# Patient Record
Sex: Female | Born: 1970 | Race: White | Hispanic: Yes | Marital: Single | Smoking: Never smoker
Health system: Southern US, Community
[De-identification: ages and names within clinical notes are randomized; demographics above are authoritative.]

## PROBLEM LIST (undated history)

## (undated) DIAGNOSIS — J302 Other seasonal allergic rhinitis: Secondary | ICD-10-CM

## (undated) DIAGNOSIS — F418 Other specified anxiety disorders: Secondary | ICD-10-CM

## (undated) DIAGNOSIS — L819 Disorder of pigmentation, unspecified: Secondary | ICD-10-CM

## (undated) HISTORY — DX: Other specified anxiety disorders: F41.8

## (undated) HISTORY — DX: Disorder of pigmentation, unspecified: L81.9

## (undated) HISTORY — DX: Other seasonal allergic rhinitis: J30.2

---

## 1997-12-15 ENCOUNTER — Encounter (INDEPENDENT_AMBULATORY_CARE_PROVIDER_SITE_OTHER): Payer: Self-pay | Admitting: *Deleted

## 1997-12-15 LAB — CONVERTED CEMR LAB

## 1998-05-23 ENCOUNTER — Emergency Department (HOSPITAL_COMMUNITY): Admission: EM | Admit: 1998-05-23 | Discharge: 1998-05-23 | Payer: Self-pay | Admitting: Emergency Medicine

## 1998-05-24 ENCOUNTER — Encounter: Payer: Self-pay | Admitting: Emergency Medicine

## 1998-10-02 ENCOUNTER — Inpatient Hospital Stay (HOSPITAL_COMMUNITY): Admission: AD | Admit: 1998-10-02 | Discharge: 1998-10-08 | Payer: Self-pay | Admitting: *Deleted

## 1998-10-11 ENCOUNTER — Encounter: Admission: RE | Admit: 1998-10-11 | Discharge: 1998-10-11 | Payer: Self-pay | Admitting: Obstetrics

## 1998-10-18 ENCOUNTER — Encounter: Admission: RE | Admit: 1998-10-18 | Discharge: 1998-10-18 | Payer: Self-pay | Admitting: *Deleted

## 1998-11-02 ENCOUNTER — Inpatient Hospital Stay (HOSPITAL_COMMUNITY): Admission: AD | Admit: 1998-11-02 | Discharge: 1998-11-02 | Payer: Self-pay | Admitting: *Deleted

## 1998-11-08 ENCOUNTER — Inpatient Hospital Stay (HOSPITAL_COMMUNITY): Admission: RE | Admit: 1998-11-08 | Discharge: 1998-11-08 | Payer: Self-pay | Admitting: Obstetrics

## 1998-11-15 ENCOUNTER — Inpatient Hospital Stay (HOSPITAL_COMMUNITY): Admission: AD | Admit: 1998-11-15 | Discharge: 1998-11-18 | Payer: Self-pay | Admitting: Obstetrics

## 1998-11-15 ENCOUNTER — Encounter (INDEPENDENT_AMBULATORY_CARE_PROVIDER_SITE_OTHER): Payer: Self-pay | Admitting: Specialist

## 1998-11-18 ENCOUNTER — Encounter (HOSPITAL_COMMUNITY): Admission: RE | Admit: 1998-11-18 | Discharge: 1999-02-16 | Payer: Self-pay | Admitting: *Deleted

## 1998-11-29 ENCOUNTER — Encounter: Admission: RE | Admit: 1998-11-29 | Discharge: 1998-11-29 | Payer: Self-pay | Admitting: Obstetrics

## 1999-01-10 ENCOUNTER — Inpatient Hospital Stay (HOSPITAL_COMMUNITY): Admission: AD | Admit: 1999-01-10 | Discharge: 1999-01-10 | Payer: Self-pay | Admitting: Obstetrics & Gynecology

## 1999-07-18 ENCOUNTER — Encounter: Payer: Self-pay | Admitting: Internal Medicine

## 1999-07-18 ENCOUNTER — Emergency Department (HOSPITAL_COMMUNITY): Admission: EM | Admit: 1999-07-18 | Discharge: 1999-07-18 | Payer: Self-pay | Admitting: Internal Medicine

## 1999-07-18 ENCOUNTER — Encounter: Payer: Self-pay | Admitting: Emergency Medicine

## 1999-08-09 ENCOUNTER — Emergency Department (HOSPITAL_COMMUNITY): Admission: EM | Admit: 1999-08-09 | Discharge: 1999-08-09 | Payer: Self-pay

## 1999-12-02 ENCOUNTER — Encounter: Admission: RE | Admit: 1999-12-02 | Discharge: 1999-12-02 | Payer: Self-pay | Admitting: Family Medicine

## 2000-02-26 ENCOUNTER — Encounter: Payer: Self-pay | Admitting: Family Medicine

## 2000-02-26 ENCOUNTER — Ambulatory Visit (HOSPITAL_COMMUNITY): Admission: RE | Admit: 2000-02-26 | Discharge: 2000-02-26 | Payer: Self-pay | Admitting: Family Medicine

## 2000-10-19 ENCOUNTER — Encounter: Admission: RE | Admit: 2000-10-19 | Discharge: 2000-10-19 | Payer: Self-pay | Admitting: Internal Medicine

## 2000-11-02 ENCOUNTER — Ambulatory Visit (HOSPITAL_COMMUNITY): Admission: RE | Admit: 2000-11-02 | Discharge: 2000-11-02 | Payer: Self-pay | Admitting: Internal Medicine

## 2000-11-02 ENCOUNTER — Encounter: Admission: RE | Admit: 2000-11-02 | Discharge: 2000-11-02 | Payer: Self-pay | Admitting: Internal Medicine

## 2000-11-02 ENCOUNTER — Encounter: Payer: Self-pay | Admitting: Internal Medicine

## 2000-12-21 ENCOUNTER — Encounter: Admission: RE | Admit: 2000-12-21 | Discharge: 2000-12-21 | Payer: Self-pay | Admitting: Family Medicine

## 2001-08-02 ENCOUNTER — Encounter: Admission: RE | Admit: 2001-08-02 | Discharge: 2001-08-02 | Payer: Self-pay | Admitting: Podiatry

## 2001-09-06 ENCOUNTER — Encounter: Admission: RE | Admit: 2001-09-06 | Discharge: 2001-09-06 | Payer: Self-pay | Admitting: Family Medicine

## 2001-09-22 ENCOUNTER — Encounter: Admission: RE | Admit: 2001-09-22 | Discharge: 2001-09-22 | Payer: Self-pay | Admitting: Family Medicine

## 2001-10-11 ENCOUNTER — Encounter: Admission: RE | Admit: 2001-10-11 | Discharge: 2001-10-11 | Payer: Self-pay | Admitting: Family Medicine

## 2001-10-14 ENCOUNTER — Encounter: Admission: RE | Admit: 2001-10-14 | Discharge: 2001-10-14 | Payer: Self-pay | Admitting: Family Medicine

## 2001-10-21 ENCOUNTER — Ambulatory Visit (HOSPITAL_COMMUNITY): Admission: RE | Admit: 2001-10-21 | Discharge: 2001-10-21 | Payer: Self-pay | Admitting: *Deleted

## 2001-11-11 ENCOUNTER — Encounter: Admission: RE | Admit: 2001-11-11 | Discharge: 2001-11-11 | Payer: Self-pay | Admitting: Sports Medicine

## 2001-12-10 ENCOUNTER — Encounter: Admission: RE | Admit: 2001-12-10 | Discharge: 2001-12-10 | Payer: Self-pay | Admitting: Family Medicine

## 2001-12-14 ENCOUNTER — Ambulatory Visit (HOSPITAL_COMMUNITY): Admission: RE | Admit: 2001-12-14 | Discharge: 2001-12-14 | Payer: Self-pay | Admitting: Family Medicine

## 2002-01-18 ENCOUNTER — Encounter: Admission: RE | Admit: 2002-01-18 | Discharge: 2002-01-18 | Payer: Self-pay | Admitting: Family Medicine

## 2002-01-25 ENCOUNTER — Encounter: Admission: RE | Admit: 2002-01-25 | Discharge: 2002-01-25 | Payer: Self-pay | Admitting: Family Medicine

## 2002-02-21 ENCOUNTER — Encounter: Admission: RE | Admit: 2002-02-21 | Discharge: 2002-02-21 | Payer: Self-pay | Admitting: Family Medicine

## 2002-03-11 ENCOUNTER — Encounter: Admission: RE | Admit: 2002-03-11 | Discharge: 2002-03-11 | Payer: Self-pay | Admitting: Family Medicine

## 2002-03-31 ENCOUNTER — Encounter: Admission: RE | Admit: 2002-03-31 | Discharge: 2002-03-31 | Payer: Self-pay | Admitting: Family Medicine

## 2002-04-18 ENCOUNTER — Encounter: Admission: RE | Admit: 2002-04-18 | Discharge: 2002-04-18 | Payer: Self-pay | Admitting: Family Medicine

## 2002-04-25 ENCOUNTER — Encounter: Admission: RE | Admit: 2002-04-25 | Discharge: 2002-04-25 | Payer: Self-pay | Admitting: Family Medicine

## 2002-04-26 ENCOUNTER — Encounter: Payer: Self-pay | Admitting: *Deleted

## 2002-04-26 ENCOUNTER — Encounter (HOSPITAL_COMMUNITY): Admission: RE | Admit: 2002-04-26 | Discharge: 2002-04-27 | Payer: Self-pay | Admitting: *Deleted

## 2002-04-26 ENCOUNTER — Inpatient Hospital Stay (HOSPITAL_COMMUNITY): Admission: AD | Admit: 2002-04-26 | Discharge: 2002-04-29 | Payer: Self-pay | Admitting: *Deleted

## 2002-05-06 ENCOUNTER — Encounter: Admission: RE | Admit: 2002-05-06 | Discharge: 2002-05-06 | Payer: Self-pay | Admitting: Family Medicine

## 2002-07-25 ENCOUNTER — Encounter: Admission: RE | Admit: 2002-07-25 | Discharge: 2002-07-25 | Payer: Self-pay | Admitting: Family Medicine

## 2002-12-14 ENCOUNTER — Ambulatory Visit (HOSPITAL_BASED_OUTPATIENT_CLINIC_OR_DEPARTMENT_OTHER): Admission: RE | Admit: 2002-12-14 | Discharge: 2002-12-14 | Payer: Self-pay | Admitting: Otolaryngology

## 2002-12-14 ENCOUNTER — Encounter (INDEPENDENT_AMBULATORY_CARE_PROVIDER_SITE_OTHER): Payer: Self-pay | Admitting: Specialist

## 2002-12-14 ENCOUNTER — Ambulatory Visit (HOSPITAL_COMMUNITY): Admission: RE | Admit: 2002-12-14 | Discharge: 2002-12-14 | Payer: Self-pay | Admitting: Otolaryngology

## 2003-02-15 ENCOUNTER — Encounter: Admission: RE | Admit: 2003-02-15 | Discharge: 2003-02-15 | Payer: Self-pay | Admitting: Family Medicine

## 2003-05-22 ENCOUNTER — Encounter: Admission: RE | Admit: 2003-05-22 | Discharge: 2003-05-22 | Payer: Self-pay | Admitting: Family Medicine

## 2003-07-04 ENCOUNTER — Encounter: Admission: RE | Admit: 2003-07-04 | Discharge: 2003-07-04 | Payer: Self-pay | Admitting: Family Medicine

## 2003-07-11 ENCOUNTER — Encounter: Admission: RE | Admit: 2003-07-11 | Discharge: 2003-07-11 | Payer: Self-pay | Admitting: Sports Medicine

## 2003-08-08 ENCOUNTER — Encounter: Admission: RE | Admit: 2003-08-08 | Discharge: 2003-08-08 | Payer: Self-pay | Admitting: Family Medicine

## 2003-08-15 ENCOUNTER — Ambulatory Visit (HOSPITAL_COMMUNITY): Admission: RE | Admit: 2003-08-15 | Discharge: 2003-08-15 | Payer: Self-pay | Admitting: *Deleted

## 2003-09-08 ENCOUNTER — Encounter: Admission: RE | Admit: 2003-09-08 | Discharge: 2003-09-08 | Payer: Self-pay | Admitting: Sports Medicine

## 2003-10-04 ENCOUNTER — Encounter: Admission: RE | Admit: 2003-10-04 | Discharge: 2003-10-04 | Payer: Self-pay | Admitting: Family Medicine

## 2003-10-10 ENCOUNTER — Encounter: Admission: RE | Admit: 2003-10-10 | Discharge: 2003-10-10 | Payer: Self-pay | Admitting: Family Medicine

## 2003-10-31 ENCOUNTER — Encounter: Admission: RE | Admit: 2003-10-31 | Discharge: 2003-10-31 | Payer: Self-pay | Admitting: Family Medicine

## 2003-11-10 ENCOUNTER — Encounter: Admission: RE | Admit: 2003-11-10 | Discharge: 2003-11-10 | Payer: Self-pay | Admitting: Family Medicine

## 2003-12-18 ENCOUNTER — Ambulatory Visit: Payer: Self-pay | Admitting: Family Medicine

## 2003-12-26 ENCOUNTER — Ambulatory Visit: Payer: Self-pay | Admitting: Family Medicine

## 2004-01-05 ENCOUNTER — Ambulatory Visit: Payer: Self-pay

## 2004-01-08 ENCOUNTER — Ambulatory Visit: Payer: Self-pay | Admitting: Family Medicine

## 2004-01-11 ENCOUNTER — Ambulatory Visit: Payer: Self-pay | Admitting: Obstetrics & Gynecology

## 2004-01-15 ENCOUNTER — Ambulatory Visit: Payer: Self-pay | Admitting: Sports Medicine

## 2004-01-17 ENCOUNTER — Ambulatory Visit: Payer: Self-pay | Admitting: Obstetrics and Gynecology

## 2004-01-22 ENCOUNTER — Ambulatory Visit: Payer: Self-pay | Admitting: *Deleted

## 2004-01-22 ENCOUNTER — Ambulatory Visit: Payer: Self-pay | Admitting: Family Medicine

## 2004-01-22 ENCOUNTER — Inpatient Hospital Stay (HOSPITAL_COMMUNITY): Admission: AD | Admit: 2004-01-22 | Discharge: 2004-01-25 | Payer: Self-pay | Admitting: Obstetrics & Gynecology

## 2004-03-13 ENCOUNTER — Ambulatory Visit: Payer: Self-pay | Admitting: Sports Medicine

## 2004-06-24 ENCOUNTER — Ambulatory Visit: Payer: Self-pay | Admitting: Internal Medicine

## 2004-07-01 ENCOUNTER — Ambulatory Visit: Payer: Self-pay | Admitting: Internal Medicine

## 2004-07-15 ENCOUNTER — Ambulatory Visit: Payer: Self-pay | Admitting: Family Medicine

## 2004-09-24 ENCOUNTER — Ambulatory Visit: Payer: Self-pay | Admitting: Family Medicine

## 2004-09-25 ENCOUNTER — Ambulatory Visit: Payer: Self-pay | Admitting: *Deleted

## 2004-10-14 ENCOUNTER — Ambulatory Visit: Payer: Self-pay | Admitting: Family Medicine

## 2004-10-16 ENCOUNTER — Ambulatory Visit (HOSPITAL_COMMUNITY): Admission: RE | Admit: 2004-10-16 | Discharge: 2004-10-16 | Payer: Self-pay | Admitting: Family Medicine

## 2004-11-08 ENCOUNTER — Ambulatory Visit: Payer: Self-pay | Admitting: Family Medicine

## 2004-11-12 ENCOUNTER — Ambulatory Visit: Payer: Self-pay | Admitting: Family Medicine

## 2006-05-14 DIAGNOSIS — F411 Generalized anxiety disorder: Secondary | ICD-10-CM | POA: Insufficient documentation

## 2006-05-14 DIAGNOSIS — J3089 Other allergic rhinitis: Secondary | ICD-10-CM

## 2006-05-15 ENCOUNTER — Encounter (INDEPENDENT_AMBULATORY_CARE_PROVIDER_SITE_OTHER): Payer: Self-pay | Admitting: *Deleted

## 2007-01-04 ENCOUNTER — Encounter: Payer: Self-pay | Admitting: Family Medicine

## 2007-01-04 ENCOUNTER — Ambulatory Visit: Payer: Self-pay | Admitting: Family Medicine

## 2007-01-04 ENCOUNTER — Encounter (INDEPENDENT_AMBULATORY_CARE_PROVIDER_SITE_OTHER): Payer: Self-pay | Admitting: Family Medicine

## 2007-01-04 DIAGNOSIS — M545 Low back pain, unspecified: Secondary | ICD-10-CM | POA: Insufficient documentation

## 2007-01-04 DIAGNOSIS — N3946 Mixed incontinence: Secondary | ICD-10-CM | POA: Insufficient documentation

## 2007-01-04 LAB — CONVERTED CEMR LAB
Bilirubin Urine: NEGATIVE
Chlamydia, DNA Probe: NEGATIVE
GC Probe Amp, Genital: NEGATIVE
Ketones, urine, test strip: NEGATIVE
Urobilinogen, UA: 0.2
pH: 6.5

## 2007-01-05 ENCOUNTER — Encounter (INDEPENDENT_AMBULATORY_CARE_PROVIDER_SITE_OTHER): Payer: Self-pay | Admitting: Family Medicine

## 2007-01-05 ENCOUNTER — Encounter: Payer: Self-pay | Admitting: Family Medicine

## 2007-01-05 ENCOUNTER — Ambulatory Visit (HOSPITAL_COMMUNITY): Admission: RE | Admit: 2007-01-05 | Discharge: 2007-01-05 | Payer: Self-pay | Admitting: Family Medicine

## 2007-01-11 ENCOUNTER — Encounter: Payer: Self-pay | Admitting: Family Medicine

## 2007-01-22 ENCOUNTER — Encounter (INDEPENDENT_AMBULATORY_CARE_PROVIDER_SITE_OTHER): Payer: Self-pay | Admitting: Family Medicine

## 2007-09-28 ENCOUNTER — Encounter (INDEPENDENT_AMBULATORY_CARE_PROVIDER_SITE_OTHER): Payer: Self-pay | Admitting: Family Medicine

## 2007-09-28 ENCOUNTER — Ambulatory Visit: Payer: Self-pay | Admitting: Internal Medicine

## 2007-09-28 LAB — CONVERTED CEMR LAB
Albumin: 4.5 g/dL (ref 3.5–5.2)
Basophils Absolute: 0 10*3/uL (ref 0.0–0.1)
Basophils Relative: 1 % (ref 0–1)
CO2: 24 meq/L (ref 19–32)
Calcium: 9 mg/dL (ref 8.4–10.5)
Creatinine, Ser: 0.61 mg/dL (ref 0.40–1.20)
Eosinophils Relative: 4 % (ref 0–5)
Lymphs Abs: 2.3 10*3/uL (ref 0.7–4.0)
Monocytes Absolute: 0.4 10*3/uL (ref 0.1–1.0)
Neutrophils Relative %: 59 % (ref 43–77)
Potassium: 4.1 meq/L (ref 3.5–5.3)
Sed Rate: 8 mm/hr (ref 0–22)
Sodium: 139 meq/L (ref 135–145)
TSH: 1.519 microintl units/mL (ref 0.350–4.50)
Total Protein: 7.3 g/dL (ref 6.0–8.3)
Vit D, 1,25-Dihydroxy: 26 — ABNORMAL LOW (ref 30–89)
WBC: 7.2 10*3/uL (ref 4.0–10.5)

## 2007-11-15 ENCOUNTER — Ambulatory Visit: Payer: Self-pay | Admitting: Internal Medicine

## 2008-01-27 ENCOUNTER — Ambulatory Visit: Payer: Self-pay | Admitting: Family Medicine

## 2008-04-25 ENCOUNTER — Encounter (INDEPENDENT_AMBULATORY_CARE_PROVIDER_SITE_OTHER): Payer: Self-pay | Admitting: Family Medicine

## 2008-04-25 ENCOUNTER — Ambulatory Visit: Payer: Self-pay | Admitting: Internal Medicine

## 2008-04-25 LAB — CONVERTED CEMR LAB: Chlamydia, DNA Probe: NEGATIVE

## 2008-05-02 ENCOUNTER — Ambulatory Visit (HOSPITAL_COMMUNITY): Admission: RE | Admit: 2008-05-02 | Discharge: 2008-05-02 | Payer: Self-pay | Admitting: Internal Medicine

## 2008-05-15 ENCOUNTER — Encounter: Admission: RE | Admit: 2008-05-15 | Discharge: 2008-05-15 | Payer: Self-pay | Admitting: Internal Medicine

## 2008-08-02 ENCOUNTER — Ambulatory Visit: Payer: Self-pay | Admitting: Family Medicine

## 2008-10-23 ENCOUNTER — Ambulatory Visit: Payer: Self-pay | Admitting: Family Medicine

## 2008-10-23 DIAGNOSIS — R002 Palpitations: Secondary | ICD-10-CM

## 2008-11-28 ENCOUNTER — Encounter: Admission: RE | Admit: 2008-11-28 | Discharge: 2008-11-28 | Payer: Self-pay | Admitting: Internal Medicine

## 2008-11-28 LAB — HM MAMMOGRAPHY

## 2009-02-19 ENCOUNTER — Ambulatory Visit: Payer: Self-pay | Admitting: Family Medicine

## 2009-02-19 DIAGNOSIS — K146 Glossodynia: Secondary | ICD-10-CM

## 2009-02-19 DIAGNOSIS — R413 Other amnesia: Secondary | ICD-10-CM | POA: Insufficient documentation

## 2009-02-23 ENCOUNTER — Ambulatory Visit: Payer: Self-pay | Admitting: Family Medicine

## 2009-02-23 ENCOUNTER — Encounter: Payer: Self-pay | Admitting: Family Medicine

## 2009-02-23 LAB — CONVERTED CEMR LAB
ALT: 16 units/L (ref 0–35)
AST: 15 units/L (ref 0–37)
Albumin: 4.6 g/dL (ref 3.5–5.2)
Alkaline Phosphatase: 64 units/L (ref 39–117)
Calcium: 9.4 mg/dL (ref 8.4–10.5)
Cholesterol: 167 mg/dL (ref 0–200)
Ferritin: 55 ng/mL (ref 10–291)
Glucose, Bld: 93 mg/dL (ref 70–99)
HDL: 50 mg/dL (ref >39)
Hemoglobin: 12.8 g/dL (ref 12.0–15.0)
LDL Cholesterol: 104 mg/dL — ABNORMAL HIGH (ref 0–99)
MCHC: 32.9 g/dL (ref 30.0–36.0)
RBC: 4.77 M/uL (ref 3.87–5.11)
Sodium: 140 meq/L (ref 135–145)
TSH: 1.531 microintl units/mL (ref 0.350–4.500)
Total Protein: 7.5 g/dL (ref 6.0–8.3)
Triglycerides: 64 mg/dL (ref <150)

## 2009-03-02 ENCOUNTER — Encounter: Payer: Self-pay | Admitting: Family Medicine

## 2009-04-05 ENCOUNTER — Encounter: Payer: Self-pay | Admitting: Family Medicine

## 2009-04-05 ENCOUNTER — Ambulatory Visit: Payer: Self-pay | Admitting: Family Medicine

## 2009-04-23 ENCOUNTER — Encounter: Payer: Self-pay | Admitting: Family Medicine

## 2009-04-30 ENCOUNTER — Ambulatory Visit (HOSPITAL_COMMUNITY): Admission: RE | Admit: 2009-04-30 | Discharge: 2009-04-30 | Payer: Self-pay | Admitting: Obstetrics & Gynecology

## 2009-06-15 ENCOUNTER — Ambulatory Visit (HOSPITAL_COMMUNITY): Admission: RE | Admit: 2009-06-15 | Discharge: 2009-06-15 | Payer: Self-pay | Admitting: Obstetrics & Gynecology

## 2009-06-15 HISTORY — PX: HYSTEROSCOPY: SHX211

## 2009-09-25 ENCOUNTER — Ambulatory Visit: Payer: Self-pay | Admitting: Family Medicine

## 2009-09-25 LAB — CONVERTED CEMR LAB

## 2009-10-01 LAB — CONVERTED CEMR LAB: Pap Smear: NEGATIVE

## 2009-10-02 ENCOUNTER — Encounter: Payer: Self-pay | Admitting: Family Medicine

## 2010-04-07 ENCOUNTER — Encounter: Payer: Self-pay | Admitting: Family Medicine

## 2010-04-08 ENCOUNTER — Encounter: Payer: Self-pay | Admitting: Internal Medicine

## 2010-04-16 NOTE — Assessment & Plan Note (Signed)
Summary: remove iud,tcb   Vital Signs:  Patient profile:   40 year old female Height:      60.4 inches Weight:      154.8 pounds BMI:     29.94 Temp:     98.3 degrees F oral Pulse rate:   77 / minute BP sitting:   112 / 68  (left arm) Cuff size:   regular  Vitals Entered By: Gladstone Pih (April 05, 2009 8:45 AM) CC: IUD removal/new IUD placement Is Patient Diabetic? No Pain Assessment Patient in pain? no        Primary Care Provider:  Antoine Primas DO  CC:  IUD removal/new IUD placement.  History of Present Illness:      Presents for IUD removal--Mirena in for 5 years. Notably has not felt her strings for 'some time". wants copper (10 yr) iud. No problems w Mirena but she would like to get away from the hormone part of the IUD.  Habits & Providers  Alcohol-Tobacco-Diet     Tobacco Status: never  Allergies: No Known Drug Allergies  Physical Exam  Genitalia:  Normal introitus for age, no external lesions, no vaginal discharge, mucosa pink and moist, no vaginal l lesions, no vaginal atrophy, no friaility or hemorrhage, normal uterus size and position, hypogpigmented lesions on cervix, IUD strings not visable Additional Exam:  Patient given informed consent for IUD removal and insertion. She has no questions. Signed copy in chart.Appropriate time out taken. Patient placed in lithotomy position. Sterile prep and sterile speculum/equipment used. Cervix cleansed X 3 with betadine. No IUD strings visible. Attempt x 3 with cytobrush and with use of endocervical speculum to identify strings but we did not see them.    Abdominlal non vascular US: IUD appaent in lower uterine segment but I cannot tell if it is imbedded in wall or in the cavity.   Impression & Recommendations:  Problem # 1:  PRESENCE OF IUD (ICD-V45.51)  Orders: IUD removal -[facility] (63875) [facility]- Est Level  3 (64332) Gynecologic Referral (Gyn)  Problem # 2:  CONTRACEPTIVE MANAGEMENT (ICD-V25.09)  we  discussed length her IUD has been inplace---time to OB appt--I think she will still be adequately covered for conception until she sees Dr Tamela Oddi but we did offer alternative methods of contraception. ( Dr [place] spoke to her in Spanish about these options) and she chose condoms.  Orders: IUD removal -[facility] (95188) [facility]- Est Level  3 (41660)  Complete Medication List: 1)  Allegra-d 24 Hour 180-240 Mg Xr24h-tab (Fexofenadine-pseudoephedrine) .Marland Kitchen.. 1 by mouth once daily for allergies. instructions in spanish. (has failed zyrtec and claritin) 2)  Fluticasone Propionate 50 Mcg/act Susp (Fluticasone propionate) .... 2 sprays each nostril daily for allergies/congestion.  instructions in spanish

## 2010-04-16 NOTE — Letter (Signed)
Summary: Results Follow-up Letter  [facility]  [address]   [place], [place] [address]   Phone: [phone]  Fax: [phone]    10/02/2009  [address] [place], [place]  [address]  Dear Ms. CHAVARRIA,   The following are the results of your recent test(s):  Test     Result     Pap Smear    Normal___x____  Not Normal_____       Comments:  Debemos repetir la prueba en 36 meses  Sincerely,  Zachery Dauer MD [facility]           Appended Document: Results Follow-up Letter mailed

## 2010-04-16 NOTE — Assessment & Plan Note (Signed)
Summary: pap,df   Vital Signs:  Patient profile:   40 year old female Menstrual status:  regular LMP:     09/03/2009 Height:      60.4 inches Weight:      149.5 pounds BMI:     28.92 Pulse rate:   92 / minute BP sitting:   107 / 67  (right arm)  Vitals Entered By: Arlyss Repress CMA, (September 25, 2009 4:17 PM) CC: physical and pap Is Patient Diabetic? No Pain Assessment Patient in pain? no      LMP (date): 09/03/2009     Menstrual Status regular Enter LMP: 09/03/2009 Last PAP Result normal   Primary Care Provider:  Antoine Primas DO  CC:  physical and pap.  History of Present Illness: She had her Marena removed under anesthesia 06/15/09 and opted not to have another IUD inserted. Husband is using condoms for contraception.  Get LLQ burning at times but no vaginal, urinary, or stool symptoms.   Desires pap smear today. All have been normal in the past.  Habits & Providers  Alcohol-Tobacco-Diet     Tobacco Status: never  Allergies: No Known Drug Allergies  Physical Exam  General:  Well-developed,well-nourished,in no acute distress; alert,appropriate and cooperative throughout examination.  Neck:  No deformities, masses, or tenderness noted.No thyromegaly Breasts:  Says she's had recent exams Lungs:  Normal respiratory effort, chest expands symmetrically. Lungs are clear to auscultation, no crackles or wheezes. Heart:  Normal rate and regular rhythm. S1 and S2 normal without gallop, murmur, click, rub or other extra sounds. Abdomen:  Bowel sounds positive,abdomen soft and non-tender without masses, organomegaly or hernias noted. Genitalia:  Normal introitus for age, no external lesions, thin vaginal discharge, mucosa pink and moist, some ectropion,no vaginal l lesions, no vaginal atrophy, no friaility or hemorrhage, normal uterus size and position,  Psych:  Oriented X3, memory intact for recent and remote, normally interactive, good eye contact, not depressed appearing      Impression & Recommendations:  Problem # 1:  AMENORRHEA (ICD-626.0) Resolved after Marena removed  Problem # 2:  CONTRACEPTIVE MANAGEMENT (ICD-V25.09) Gave her info on contraceptive choices and she will call if she wants to try another method more secure than what she is doing now Orders: [facility]- Est Level  3 (99213)  Problem # 3:  SCREENING FOR MALIGNANT NEOPLASM, CERVIX (ICD-V76.2) If this pap is normal will switch to every 3 year paps Orders: Pap Smear-[facility] (04540-98119) [facility]- Est Level  3 (99213)  Problem # 4:  RHINITIS, ALLERGIC (ICD-477.9)  Her updated medication list for this problem includes:    Fluticasone Propionate 50 Mcg/act Susp (Fluticasone propionate) .Marland Kitchen... 2 sprays each nostril daily for allergies/congestion.  instructions in spanish  Orders: [facility]- Est Level  3 (14782)  Complete Medication List: 1)  Allegra-d 24 Hour 180-240 Mg Xr24h-tab (Fexofenadine-pseudoephedrine) .Marland Kitchen.. 1 by mouth once daily for allergies. instructions in spanish. (has failed zyrtec and claritin) 2)  Fluticasone Propionate 50 Mcg/act Susp (Fluticasone propionate) .... 2 sprays each nostril daily for allergies/congestion.  instructions in spanish  Patient Instructions: 1)  Please schedule a follow-up appointment in 1 year.  Prescriptions: FLUTICASONE PROPIONATE 50 MCG/ACT SUSP (FLUTICASONE PROPIONATE) 2 sprays each nostril daily for allergies/congestion.  Instructions in spanish  #1 x 11   Entered and Authorized by:   Zachery Dauer MD   Signed by:   Zachery Dauer MD on 09/25/2009   Method used:   Print then Give to Patient   RxID:   [id number]

## 2010-04-16 NOTE — Miscellaneous (Signed)
Summary: Consent IUD Removal & IUD Placement  Consent IUD Removal & IUD Placement   Imported By: Clydell Hakim 04/09/2009 11:14:36  _____________________________________________________________________  External Attachment:    Type:   Image     Comment:   External Document

## 2010-04-16 NOTE — Consult Note (Signed)
Summary: [facility]   Imported By: Clydell Hakim 04/27/2009 11:20:38  _____________________________________________________________________  External Attachment:    Type:   Image     Comment:   External Document

## 2010-04-21 ENCOUNTER — Encounter: Payer: Self-pay | Admitting: *Deleted

## 2010-05-21 ENCOUNTER — Ambulatory Visit (INDEPENDENT_AMBULATORY_CARE_PROVIDER_SITE_OTHER): Payer: Self-pay | Admitting: Family Medicine

## 2010-05-21 ENCOUNTER — Encounter: Payer: Self-pay | Admitting: Family Medicine

## 2010-05-21 DIAGNOSIS — I8390 Asymptomatic varicose veins of unspecified lower extremity: Secondary | ICD-10-CM

## 2010-05-21 DIAGNOSIS — R229 Localized swelling, mass and lump, unspecified: Secondary | ICD-10-CM | POA: Insufficient documentation

## 2010-05-21 DIAGNOSIS — I868 Varicose veins of other specified sites: Secondary | ICD-10-CM | POA: Insufficient documentation

## 2010-05-21 NOTE — Assessment & Plan Note (Signed)
Very small nodule, likely old subQ foreign body. No need for concern unless it changes.

## 2010-05-21 NOTE — Progress Notes (Signed)
  Subjective:    Patient ID: Alison Farmer, female    DOB: 1970-10-09, 40 y.o.   MRN: [id number]  HPIShe is concerned because 3 weeks ago she developed a purple area on her dorsal L forearm with no other history of contusions. Her menses have be no heavier than normal. She has a small nodule overlying her lateral mid radius present for as long as she can remember.   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   Interviewed conducted in [place]   Review of Systems     Objective:   Physical Exam  Constitutional: She appears well-developed and well-nourished.  Abdominal: She exhibits mass.       2 mm mobile nodule overlying lateral mid radius without overlying scar.   Musculoskeletal: She exhibits tenderness.       No abnormality found on dorsal mid-forearm where the bruise was  Skin: Skin is warm and dry.  Fine superficial veins over both lower legs        Assessment & Plan:

## 2010-05-21 NOTE — Patient Instructions (Signed)
Puede comprar calcetines de presion para las piernas bajas.   Graduated pressure stocking.

## 2010-06-05 LAB — CBC
Hemoglobin: 12.8 g/dL (ref 12.0–15.0)
MCV: 82 fL (ref 78.0–100.0)
Platelets: 154 10*3/uL (ref 150–400)
RBC: 4.61 MIL/uL (ref 3.87–5.11)
WBC: 8.2 10*3/uL (ref 4.0–10.5)

## 2010-08-02 NOTE — Op Note (Signed)
   Alison Farmer, Alison Farmer                     ACCOUNT NO.:  [id number]   MEDICAL RECORD NO.:  [id number]                   PATIENT TYPE:  AMB   LOCATION:  [facility]                                  FACILITY:  [facility]   PHYSICIAN:  Christopher E. Ezzard Standing, M.D.         DATE OF BIRTH:  [birth year]   DATE OF PROCEDURE:  12/14/2002  DATE OF DISCHARGE:                                 OPERATIVE REPORT   PREOPERATIVE DIAGNOSIS:  Pigmented tongue lesion.   POSTOPERATIVE DIAGNOSIS:  Pigmented tongue lesion.   OPERATION PERFORMED:  Excisional biopsy of a pigmented tongue lesion.   SURGEON:  Kristine Garbe. Ezzard Standing, M.D.   ANESTHESIA:  Local 1% Xylocaine with 1:100,000 epinephrine.   COMPLICATIONS:  None.   INDICATIONS FOR PROCEDURE:  The patient is a 40 year old female who has had  a pigmented lesion on her tongue that she is concerned about.  On  examination, she has a dark pigmented  lesions, actually several lesions on  the tongue, the most prominent on the left lateral tongue.  Because this has  given her concern, she is taken to the operating room at this time for  incisional biopsy of pigmented lesion on the tongue.  She describes some  discomfort in her mouth but there is no exudate and no real ulceration of  the pigmented lesion.  It is soft to palpation.   DESCRIPTION OF PROCEDURE:  The left lateral tongue was injected with 1mL of  Xylocaine with epinephrine for local anesthetic.  The pigmented lesion was  incisionally biopsied and specimen was sent to pathology. The small defect  was closed with two interrupted 4-0 chromic sutures.  The patient tolerated  the procedure well and is subsequently discharged to home later this  morning.   DISPOSITION:  Alison Farmer is discharged home on Tylenol as needed for pain.  Will have her follow up in my office in one week to review pathology.                                               Kristine Garbe. Ezzard Standing, M.D.    CEN/MEDQ  D:  12/14/2002   T:  12/14/2002  Job:  [id number]

## 2012-02-03 ENCOUNTER — Encounter (INDEPENDENT_AMBULATORY_CARE_PROVIDER_SITE_OTHER): Payer: Managed Care, Other (non HMO) | Admitting: Family Medicine

## 2012-02-03 ENCOUNTER — Ambulatory Visit: Payer: Managed Care, Other (non HMO) | Admitting: *Deleted

## 2012-02-03 ENCOUNTER — Encounter: Payer: Self-pay | Admitting: Family Medicine

## 2012-02-03 VITALS — BP 115/76 | HR 76 | Ht 60.4 in | Wt 156.0 lb

## 2012-02-03 DIAGNOSIS — Z23 Encounter for immunization: Secondary | ICD-10-CM

## 2012-02-04 NOTE — Progress Notes (Signed)
This encounter was created in error - please disregard.

## 2012-02-24 ENCOUNTER — Other Ambulatory Visit: Payer: Self-pay | Admitting: Vascular Surgery

## 2012-02-26 ENCOUNTER — Other Ambulatory Visit: Payer: Self-pay | Admitting: *Deleted

## 2012-02-26 DIAGNOSIS — M79609 Pain in unspecified limb: Secondary | ICD-10-CM

## 2012-02-26 DIAGNOSIS — I83893 Varicose veins of bilateral lower extremities with other complications: Secondary | ICD-10-CM

## 2012-04-06 ENCOUNTER — Encounter: Payer: Self-pay | Admitting: Vascular Surgery

## 2012-04-07 ENCOUNTER — Encounter: Payer: Managed Care, Other (non HMO) | Admitting: Vascular Surgery

## 2013-07-08 ENCOUNTER — Ambulatory Visit (INDEPENDENT_AMBULATORY_CARE_PROVIDER_SITE_OTHER): Payer: 59 | Admitting: Family Medicine

## 2013-07-08 ENCOUNTER — Encounter: Payer: Self-pay | Admitting: Family Medicine

## 2013-07-08 VITALS — BP 114/72 | HR 75 | Wt 157.0 lb

## 2013-07-08 DIAGNOSIS — I8390 Asymptomatic varicose veins of unspecified lower extremity: Secondary | ICD-10-CM

## 2013-07-08 DIAGNOSIS — I868 Varicose veins of other specified sites: Secondary | ICD-10-CM

## 2013-07-08 DIAGNOSIS — J309 Allergic rhinitis, unspecified: Secondary | ICD-10-CM

## 2013-07-08 LAB — CBC
HEMATOCRIT: 33.9 % — AB (ref 36.0–46.0)
Hemoglobin: 11.7 g/dL — ABNORMAL LOW (ref 12.0–15.0)
MCH: 26.1 pg (ref 26.0–34.0)
MCHC: 34.5 g/dL (ref 30.0–36.0)
MCV: 75.7 fL — AB (ref 78.0–100.0)
PLATELETS: 172 10*3/uL (ref 150–400)
RBC: 4.48 MIL/uL (ref 3.87–5.11)
RDW: 14.5 % (ref 11.5–15.5)
WBC: 7.8 10*3/uL (ref 4.0–10.5)

## 2013-07-08 MED ORDER — FEXOFENADINE-PSEUDOEPHED ER 180-240 MG PO TB24: 1.0000 | ORAL_TABLET | Freq: Every day | ORAL | Status: DC

## 2013-07-08 MED ORDER — FLUTICASONE PROPIONATE 50 MCG/ACT NA SUSP: 2.0000 | Freq: Every day | NASAL | Status: DC

## 2013-07-08 NOTE — Patient Instructions (Signed)
Please call [facility]. [phone]  Dr. Randolm IdolFletke will call you about your lab work.

## 2013-07-09 LAB — CMP AND LIVER
ALBUMIN: 4.2 g/dL (ref 3.5–5.2)
ALK PHOS: 63 U/L (ref 39–117)
ALT: 11 U/L (ref 0–35)
AST: 17 U/L (ref 0–37)
BILIRUBIN INDIRECT: 0.3 mg/dL (ref 0.2–1.2)
BUN: 11 mg/dL (ref 6–23)
Bilirubin, Direct: 0.1 mg/dL (ref 0.0–0.3)
CALCIUM: 8.9 mg/dL (ref 8.4–10.5)
CHLORIDE: 104 meq/L (ref 96–112)
CO2: 27 meq/L (ref 19–32)
Creat: 0.7 mg/dL (ref 0.50–1.10)
Glucose, Bld: 84 mg/dL (ref 70–99)
POTASSIUM: 3.8 meq/L (ref 3.5–5.3)
SODIUM: 139 meq/L (ref 135–145)
Total Bilirubin: 0.4 mg/dL (ref 0.2–1.2)
Total Protein: 7 g/dL (ref 6.0–8.3)

## 2013-07-09 LAB — TSH: TSH: 1.698 u[IU]/mL (ref 0.350–4.500)

## 2013-07-09 NOTE — Assessment & Plan Note (Signed)
Symptoms worse over the past few weeks. -Refills provided for Flonase and Allegra

## 2013-07-09 NOTE — Assessment & Plan Note (Signed)
Patient reports worsening pain due to varicose veins. -Patient will be referred to [facility] (phone number given to patient) -Will check CMP to evaluate liver and kidney function, no signs of cardiac etiology of LE edema

## 2013-07-09 NOTE — Progress Notes (Signed)
   Subjective:    Patient ID: Alison Farmer, female    DOB: [birth year], 43 y.o.   MRN: [id number]  HPI 43 y/o female presents for evaluation of varicose veins. 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 She has previously been referred to a vein specialist however never received information on her appointment time.  She does report her heart stopping/skipping a beat, this has occurred only 3 times in her life, last event was 3 months ago, only occurs when she is asleep, reports a dream where she saw herself enter her bedroom and her heart stopped for few seconds, no associated loc or syncope, patient reports no such events at work or while working out, no chest pain, no sob, no palpitations, no syncope, no lightheadedness  Allergic Rhinitis - patient requests refills on Allegra and Flonase, recently ran out and her allergies have have been worse over the past few weeks.    Review of Systems  Constitutional: Negative for fever, chills and fatigue.  Respiratory: Negative for cough, choking, shortness of breath and wheezing.   Cardiovascular: Positive for leg swelling. Negative for chest pain.  Gastrointestinal: Negative for nausea, vomiting and diarrhea.  Neurological: Negative for dizziness, syncope, light-headedness and headaches.  Psychiatric/Behavioral: Positive for behavioral problems.  No PND, no orthopnea     Objective:   Physical Exam  Constitutional: Vital signs are normal. She appears well-developed. She is cooperative.  HENT:  Head: Normocephalic.  Mouth/Throat: Uvula is midline, oropharynx is clear and moist and mucous  membranes are normal.  Neck: Neck supple.  Cardiovascular: Normal rate, regular rhythm and normal heart sounds.   No murmur heard. Pulmonary/Chest: Effort normal and breath sounds normal.  Abdominal: Soft. Normal appearance and bowel sounds are normal. There is no hepatomegaly. There is no tenderness.  Lymphadenopathy:    She has no cervical adenopathy.  Neurological: She is alert.  Skin: Skin is warm, dry and intact.  Ext: varicose veins present on bilateral LE (worst inferiorly to her knees), trace edema present, 2+ DP and PT pulses present      Assessment & Plan:  Please see problem specific assessment and plan.

## 2013-07-11 ENCOUNTER — Encounter: Payer: Self-pay | Admitting: Family Medicine

## 2013-07-11 ENCOUNTER — Telehealth: Payer: Self-pay | Admitting: Family Medicine

## 2013-07-11 NOTE — Telephone Encounter (Signed)
Opened in error

## 2014-10-04 ENCOUNTER — Encounter: Payer: Self-pay | Admitting: Obstetrics & Gynecology

## 2016-08-06 ENCOUNTER — Ambulatory Visit: Payer: Self-pay | Attending: Family Medicine | Admitting: Family Medicine

## 2016-08-06 VITALS — BP 97/67 | HR 82 | Temp 98.5°F | Resp 18 | Ht 60.0 in | Wt 162.0 lb

## 2016-08-06 DIAGNOSIS — J302 Other seasonal allergic rhinitis: Secondary | ICD-10-CM

## 2016-08-06 DIAGNOSIS — K219 Gastro-esophageal reflux disease without esophagitis: Secondary | ICD-10-CM | POA: Insufficient documentation

## 2016-08-06 DIAGNOSIS — J309 Allergic rhinitis, unspecified: Secondary | ICD-10-CM | POA: Insufficient documentation

## 2016-08-06 DIAGNOSIS — K146 Glossodynia: Secondary | ICD-10-CM | POA: Insufficient documentation

## 2016-08-06 DIAGNOSIS — R0989 Other specified symptoms and signs involving the circulatory and respiratory systems: Secondary | ICD-10-CM

## 2016-08-06 DIAGNOSIS — J3089 Other allergic rhinitis: Secondary | ICD-10-CM

## 2016-08-06 DIAGNOSIS — R591 Generalized enlarged lymph nodes: Secondary | ICD-10-CM

## 2016-08-06 DIAGNOSIS — K0530 Chronic periodontitis, unspecified: Secondary | ICD-10-CM | POA: Insufficient documentation

## 2016-08-06 DIAGNOSIS — Z8719 Personal history of other diseases of the digestive system: Secondary | ICD-10-CM

## 2016-08-06 MED ORDER — FLUTICASONE PROPIONATE 50 MCG/ACT NA SUSP: 2.0000 | Freq: Every day | NASAL | 6 refills | Status: DC

## 2016-08-06 MED ORDER — RANITIDINE HCL 75 MG PO TABS: 75.0000 mg | ORAL_TABLET | Freq: Two times a day (BID) | ORAL | Status: DC

## 2016-08-06 MED ORDER — PENICILLIN V POTASSIUM 250 MG PO TABS: 250.0000 mg | ORAL_TABLET | Freq: Four times a day (QID) | ORAL | 0 refills | Status: DC

## 2016-08-06 MED ORDER — FEXOFENADINE HCL 180 MG PO TABS: 180.0000 mg | ORAL_TABLET | Freq: Every day | ORAL | 6 refills | Status: DC

## 2016-08-06 NOTE — Progress Notes (Signed)
Patient is here for establish care   Patient complains about sore throat   Patient has thorat issues & tongue discoloration  Patient also stated that's he has bleeding gums

## 2016-08-06 NOTE — Patient Instructions (Addendum)
Apply for orange card to complete referral process.   Alergias (Allergies) La alergia ocurre cuando el cuerpo reacciona a una sustancia de un modo que no es normal. Una reaccin alrgica puede producirse despus de cualquiera de estas acciones:  Comer algo.  Inhalar algo.  Tocar algo. CULES SON LAS CLASES DE ALERGIAS? Se puede ser alrgico a lo siguiente:  Cosas que surgen solo durante ciertas temporadas, como moho y [place].  Alimentos.  Medicamentos.  Insectos.  Caspa de los [place]. CULES SON LOS SNTOMAS DE LAS ALERGIAS?  Hinchazn. Puede aparecer en los labios, la cara, la lengua, la boca o la garganta.  Estornudos.  Tos.  Respiracin ruidosa (sibilancias).  Nariz tapada.  Hormigueo en la boca.  Una erupcin.  Picazn.  Zonas de piel hinchadas, rojas y que producen picazn (ronchas).  Lagrimeo.  Vmitos.  Deposiciones lquidas (diarrea).  Mareos.  Desmayo o sensacin de desvanecimiento.  Problemas para respirar o tragar.  Sensacin de opresin en el pecho.  Latidos cardacos acelerados. CMO SE DIAGNOSTICAN LAS ALERGIAS? Las alergias pueden diagnosticarse mediante lo siguiente:  Antecedentes mdicos y familiares.  Pruebas cutneas.  Anlisis de [place].  Un registro de alimentos. Un registro de [employer], las bebidas y los sntomas diarios.  Los resultados de una dieta de eliminacin. Esta dieta implica asegurarse de no comer determinados alimentos y [employer]s ver qu sucede cuando comienza a comerlos de nuevo. CMO SE TRATAN LAS ALERGIAS? No hay una cura para las [place], pero las reacciones alrgicas pueden tratarse con medicamentos. Generalmente, las reacciones graves deben tratarse en un hospital. CMO PUEDEN PREVENIRSE LAS REACCIONES? La mejor manera de prevenir una reaccin alrgica es evitar el elemento que le causa Programmer, multimedia. Las vacunas y los medicamentos para la alergia tambin pueden ayudar a  prevenir las reacciones en [employer]. Esta informacin no tiene Theme park manager el consejo del mdico. Asegrese de hacerle al mdico cualquier pregunta que tenga. Document Released: 11/03/2012 Document Revised: 03/24/2014 Document Reviewed: 12/13/2013 Elsevier Interactive Patient Education  2017 [employer]. La dieta y las enfermedades dentales (Diet and Dental Disease) Lo que come afecta su Dentist. Public relations account executive los alimentos correctos puede ayudarlo a Pharmacologist sus dientes sanos y Automotive engineer los problemas dentales, como las caries y la enfermedad de las encas. Debe saber qu alimentos pueden ayudar a [employer] fuertes y cules pueden causarle problemas dentales. Si no recibe los nutrientes [employer] dieta, es posible que el organismo pierda la capacidad para prevenir las enfermedades dentales. La higiene bucal correcta es muy importante para mantener la buena salud de los dientes y las encas. Esto incluye el cepillado dental dos veces por da con un dentfrico con flor, el uso diario de hilo dental y la consulta habitual con el dentista para realizarse limpiezas dentales profesionales. QU PAUTAS GENERALES DEBO SEGUIR? Pautas en cuanto a los alimentos  Coma una dieta sana y [place] equilibrada, que incluya una variedad de alimentos en cantidades moderadas todos [place]. Coma menos alimentos que contengan altas cantidades de azcar o carbohidratos simples de fcil digestin (refinados). La dieta debe incluir lo siguiente:  Nils Pyle y verduras con alto contenido de [place].  Fuentes [place] de protenas, como [place], carnes [place], [place], pescado y productos lcteos sin grasa o con bajo contenido de [place].  Cereales integrales. Estos incluyen el arroz integral, el pan integral y las pastas.  Alimentos frescos, integrales y Lawyer. Estos pueden ayudarlo a producir ms saliva, que es importante para la [employer]  dental.  Intente esperar por lo menos 2horas entre cada  comida, colacin y bebida.  Evite los alimentos con alto contenido de azcares o carbohidratos simples, como las Adamsgolosinas, las tortas y Heb[place]villelos jarabes. La ingesta frecuente de estos alimentos durante largos perodos puede aumentar el riesgo de tener caries dentales.  Evite el consumo de alimentos pegajosos o cidos por s solos, Motorolacomo los caramelos, las frutas disecadas, las Harrisonjaleas o las frutas ctricas. Si los consume, hgalo con las comidas y no entre una comida y [place]otra.  Inmediatamente despus de [place]a comida o de una colacin, mastique goma de Theatre managermascar sin International aid/development workerazcar. Pautas en cuanto a las bebidas  Enjuguese la boca con agua despus de consumir colaciones con azcar.  No tome de a sorbos las [address]bebidas [address]azucaradas, como las Whitneygaseosas. Tomar estas bebidas de a sorbos durante perodos prolongados puede aumentar el riesgo de tener caries dentales.  No mantenga en la boca las bebidas cidas o azucaradas, ni se haga buches con ellas. QU ALIMENTOS PUEDEN AUMENTAR EL RIESGO QUE TENGO DE SUFRIR PROBLEMAS DENTALES?  Cualquier alimento o bebida que contenga azcar o est endulzado con azcar. En trminos generales, lo mejor es evitar estos alimentos o solo consumirlos con moderacin. Esta lista incluye bebidas frutales, bebidas gaseosas, bebidas deportivas o energizantes, as como cafs y ts azucarados.  Alimentos pegajosos. Estos incluyen las pasas, otras frutas disecadas y las golosinas que sean pegajosas, duras o que se disuelvan lentamente. Lo mejor es evitarlos.  Alimentos con alto contenido de carbohidratos refinados o de International aid/development workerazcar. Estos incluyen las galletas, las tortas, los muffins, las papas fritas y las Perrygalletas.  Azcares simples, como sacarosa, miel y Carroll[place]melaza.  Alimentos y bebidas cidos. Estos Sealed Air Corporationincluyen los tomates, las frutas ctricas, el caf y el jugo de fruta. Estos alimentos y estas bebidas pueden debilitar el esmalte dental y provocar sensibilidad dental, erosin y picaduras. Es posible que los  productos que se enumeran ms arriba no sean una lista completa de los alimentos y las bebidas que se deben Film/video editorlimitar o Automotive engineerevitar. Comunquese con el nutricionista para obtener ms informacin. QU ALIMENTOS PUEDEN REDUCIR EL RIESGO QUE TENGO DE SUFRIR PROBLEMAS DENTALES?  Los alimentos con alto contenido de vitaminasC yA, como las frutas y las verduras frescas. Estas vitaminas son importantes para Photographermantener la salud de las encas y formar el Engineer, structuralesmalte dental.  Alimentos con protenas de alta calidad. Estos incluyen las carnes [place] Lak[place]ras, los Elliotthuevos, [address]el queso, la [place]leche, el yogur, el pescado, los frijoles y las legumbres.  Panes y cereales integrales con bajo contenido de azcar.  Goma de Theatre managermascar y mentas sin International aid/development workerazcar.  Alimentos que sean buenas fuentes de calcio. Estos incluyen el queso, la [place], el yogur natural, el tofu fortificado con calcio, las verduras de hojas verdes y las Bear Creekalmendras.  [place]gu[place], especialmente fluorada. Los artculos mencionados arriba pueden no ser Raytheonuna lista completa de las bebidas o los alimentos recomendados. Comunquese con el nutricionista para conocer ms opciones. Esta informacin no tiene Theme park managercomo fin reemplazar el consejo del mdico. Asegrese de hacerle al mdico cualquier pregunta que tenga. Document Released: 11/26/2011 Document Revised: 03/24/2014 Document Reviewed: 08/02/2013 Elsevier Interactive Patient Education  2017 [employer]vin[employer]vier [employer]

## 2016-08-06 NOTE — Progress Notes (Signed)
Subjective:  Patient ID: Alison Farmer, female    DOB: 23-Sep-1970  Age: 46 y.o. MRN: [id number]  CC: Establish Care   HPI Nichole Neyer presents for  is here for evaluation of possible allergic rhinitis and conjunctivitis. Patient's symptoms include clear rhinorrhea, cough, itchy eyes, nasal congestion, sinus pressure, sneezing and watery eyes. These symptoms are perennial with seasonal exacerbation. Current triggers include exposure to pollens and weather changes. The patient has been suffering from these symptoms for approximately several years. The patient has tried over the counter medications with inadequate relief of symptoms. Immunotherapy: she reports receiving allergies shots in the past, but stopped follow up.  The patient has never had nasal polyps. The patient has no history of asthma. The patient does not suffer from frequent sinopulmonary infections. The patient has not had sinus surgery in the past. The patient has no history of eczema. She reports sensation of tongue burning and tingling for 3 days. She denies any difficulty swallowing or body paresthesias. She reports history of gum bleeding with toothbrushing 3 weeks ago. She reports flossing regularly. She denies any symptoms of dental abscess. She is requesting dentist referral.    Outpatient Medications Prior to Visit  Medication Sig Dispense Refill  . fexofenadine-pseudoephedrine (ALLEGRA-D 24) 180-240 MG per 24 hr tablet Take 1 tablet by mouth daily. Instructions in spanish (has failed zyrtec and claritin) (Patient not taking: Reported on 08/06/2016) 90 tablet 1  . fluticasone (FLONASE) 50 MCG/ACT nasal spray Place 2 sprays into both nostrils daily. for allergies/congestion. Instructions in spanish (Patient not taking: Reported on 08/06/2016) 16 g 1   No facility-administered medications prior to visit.     ROS Review of Systems  Constitutional: Negative.   HENT: Positive for postnasal drip, rhinorrhea and  sore throat.   Eyes: Positive for itching.  Respiratory: Negative.   Cardiovascular: Negative.   Skin: Negative.   Neurological: Negative.   Psychiatric/Behavioral: Negative.     Objective:  BP 97/67 (BP Location: Left Arm, Patient Position: Sitting, Cuff Size: Normal)   Pulse 82   Temp 98.5 F (36.9 C) (Oral)   Resp 18   Ht 5' (1.524 m)   Wt 162 lb (73.5 kg)   SpO2 98%   BMI 31.64 kg/m   BP/Weight 08/06/2016 07/08/2013 02/03/2012  Systolic BP 97 114 115  Diastolic BP 67 72 76  Wt. (Lbs) 162 157 156  BMI 31.64 30.26 30.07    Physical Exam  Constitutional: She is oriented to person, place, and time. She appears well-developed and well-nourished.  HENT:  Head: Atraumatic.  Right Ear: External ear normal.  Left Ear: External ear normal.  Nose: Mucosal edema and rhinorrhea present.  Mouth/Throat: Oropharynx is clear and moist and mucous membranes are normal. No posterior oropharyngeal edema or posterior oropharyngeal erythema.    Eyes: Conjunctivae are normal. Pupils are equal, round, and reactive to light.  Neck: No JVD present.  Cardiovascular: Normal rate, regular rhythm, normal heart sounds and intact distal pulses.   Pulmonary/Chest: Effort normal and breath sounds normal.  Abdominal: Soft. Bowel sounds are normal.  Lymphadenopathy:    She has no cervical adenopathy.  Neurological: She is alert and oriented to person, place, and time.  Skin: Skin is warm and dry.  Psychiatric: She has a normal mood and affect.  Nursing note and vitals reviewed.  Assessment & Plan:   Problem List Items Addressed This Visit    None    Visit Diagnoses    Perennial allergic rhinitis with  seasonal variation    -  Primary   Relevant Medications   fluticasone (FLONASE) 50 MCG/ACT nasal spray   fexofenadine (ALLEGRA ALLERGY) 180 MG tablet   Other Relevant Orders   Ambulatory referral to Allergy   History of gastroesophageal reflux (GERD)       Relevant Medications   ranitidine  (ZANTAC 75) 75 MG tablet   Tongue burning sensation       Relevant Orders   CBC with Differential (Completed)   Vitamin B12 (Completed)   Basic Metabolic Panel (Completed)   Periodontitis       Relevant Medications   penicillin v potassium (VEETID) 250 MG tablet   Other Relevant Orders   Ambulatory referral to Dentistry   Tenderness of lymph node       Relevant Orders   CBC with Differential (Completed)   Thyroid Panel With TSH (Completed)      Meds ordered this encounter  Medications  . fluticasone (FLONASE) 50 MCG/ACT nasal spray    Sig: Place 2 sprays into both nostrils daily.    Dispense:  16 g    Refill:  6    Order Specific Question:   Supervising Provider    Answer:   Quentin AngstJEGEDE, OLUGBEMIGA E [id number]]  . fexofenadine (ALLEGRA ALLERGY) 180 MG tablet    Sig: Take 1 tablet (180 mg total) by mouth daily.    Dispense:  30 tablet    Refill:  6    Order Specific Question:   Supervising Provider    Answer:   Quentin AngstJEGEDE, OLUGBEMIGA E [id number]]  . ranitidine (ZANTAC 75) 75 MG tablet    Sig: Take 1 tablet (75 mg total) by mouth 2 (two) times daily.    Order Specific Question:   Supervising Provider    Answer:   Quentin AngstJEGEDE, OLUGBEMIGA E [id number]  . penicillin v potassium (VEETID) 250 MG tablet    Sig: Take 1 tablet (250 mg total) by mouth every 6 (six) hours.    Dispense:  20 tablet    Refill:  0    Order Specific Question:   Supervising Provider    Answer:   Quentin AngstJEGEDE, OLUGBEMIGA E [[id number]]    Follow-up: Return in about 2 weeks (around 08/20/2016) for Birth Control .   Lizbeth BarkMandesia R Terita Hejl FNP

## 2016-08-07 LAB — CBC WITH DIFFERENTIAL/PLATELET
BASOS ABS: 0 10*3/uL (ref 0.0–0.2)
Basos: 1 %
EOS (ABSOLUTE): 0.5 10*3/uL — AB (ref 0.0–0.4)
Eos: 6 %
HEMOGLOBIN: 12.3 g/dL (ref 11.1–15.9)
Hematocrit: 37.7 % (ref 34.0–46.6)
IMMATURE GRANS (ABS): 0 10*3/uL (ref 0.0–0.1)
IMMATURE GRANULOCYTES: 0 %
LYMPHS ABS: 2.8 10*3/uL (ref 0.7–3.1)
LYMPHS: 38 %
MCH: 25.8 pg — AB (ref 26.6–33.0)
MCHC: 32.6 g/dL (ref 31.5–35.7)
MCV: 79 fL (ref 79–97)
MONOS ABS: 0.3 10*3/uL (ref 0.1–0.9)
Monocytes: 4 %
NEUTROS ABS: 3.7 10*3/uL (ref 1.4–7.0)
Neutrophils: 51 %
Platelets: 191 10*3/uL (ref 150–379)
RBC: 4.76 x10E6/uL (ref 3.77–5.28)
RDW: 14.4 % (ref 12.3–15.4)
WBC: 7.2 10*3/uL (ref 3.4–10.8)

## 2016-08-07 LAB — THYROID PANEL WITH TSH
FREE THYROXINE INDEX: 1.9 (ref 1.2–4.9)
T3 UPTAKE RATIO: 22 % — AB (ref 24–39)
T4 TOTAL: 8.6 ug/dL (ref 4.5–12.0)
TSH: 1.9 u[IU]/mL (ref 0.450–4.500)

## 2016-08-07 LAB — BASIC METABOLIC PANEL
BUN/Creatinine Ratio: 11 (ref 9–23)
BUN: 7 mg/dL (ref 6–24)
CALCIUM: 9.7 mg/dL (ref 8.7–10.2)
CO2: 24 mmol/L (ref 18–29)
Chloride: 102 mmol/L (ref 96–106)
Creatinine, Ser: 0.65 mg/dL (ref 0.57–1.00)
GFR, EST AFRICAN AMERICAN: 124 mL/min/{1.73_m2} (ref >59)
GFR, EST NON AFRICAN AMERICAN: 108 mL/min/{1.73_m2} (ref >59)
Glucose: 108 mg/dL — ABNORMAL HIGH (ref 65–99)
POTASSIUM: 4.3 mmol/L (ref 3.5–5.2)
Sodium: 141 mmol/L (ref 134–144)

## 2016-08-07 LAB — VITAMIN B12: Vitamin B-12: 450 pg/mL (ref 232–1245)

## 2016-08-08 ENCOUNTER — Ambulatory Visit: Payer: Self-pay | Attending: Family Medicine

## 2016-08-12 ENCOUNTER — Telehealth: Payer: Self-pay

## 2016-08-12 NOTE — Telephone Encounter (Signed)
CMA call regarding lab results   Patient verify DOB  Patient was aware and understood  

## 2016-08-12 NOTE — Telephone Encounter (Signed)
-----   Message from Lizbeth BarkMandesia R Hairston, [place]NP sent at 08/08/2016 11:38 AM EDT ----- Labs that evaluated your blood cells, fluid and electrolyte balance are normal. No signs of anemia, infection, or inflammation present. Vitamin B12 level is normal. When decreased it can cause inflammation, swelling, and pain of the tongue as well as tingling and numbness sensation of the hand or feet.  Thyroid function normal.

## 2016-09-05 ENCOUNTER — Ambulatory Visit: Payer: Self-pay | Attending: Family Medicine | Admitting: Family Medicine

## 2016-09-05 ENCOUNTER — Other Ambulatory Visit: Payer: Self-pay | Admitting: Family Medicine

## 2016-09-05 VITALS — BP 124/72 | HR 83 | Temp 98.2°F | Resp 16 | Wt 166.4 lb

## 2016-09-05 DIAGNOSIS — H538 Other visual disturbances: Secondary | ICD-10-CM | POA: Insufficient documentation

## 2016-09-05 DIAGNOSIS — Z3202 Encounter for pregnancy test, result negative: Secondary | ICD-10-CM | POA: Insufficient documentation

## 2016-09-05 DIAGNOSIS — Z30013 Encounter for initial prescription of injectable contraceptive: Secondary | ICD-10-CM | POA: Insufficient documentation

## 2016-09-05 LAB — POCT URINE PREGNANCY: PREG TEST UR: NEGATIVE

## 2016-09-05 LAB — POCT GLYCOSYLATED HEMOGLOBIN (HGB A1C): HEMOGLOBIN A1C: 5.6

## 2016-09-05 MED ORDER — MEDROXYPROGESTERONE ACETATE 150 MG/ML IM SUSP
150.0000 mg | Freq: Once | INTRAMUSCULAR | Status: AC
  Administered 2016-09-05: 150 mg via INTRAMUSCULAR

## 2016-09-05 NOTE — Patient Instructions (Signed)
Medroxyprogesterone injection [Contraceptive] What is this medicine? MEDROXYPROGESTERONE (me DROX ee proe JES te rone) contraceptive injections prevent pregnancy. They provide effective birth control for 3 months. Depo-subQ Provera 104 is also used for treating pain related to endometriosis. This medicine may be used for other purposes; ask your health care provider or pharmacist if you have questions. COMMON BRAND NAME(S): Depo-Provera, Depo-subQ Provera 104 What should I tell my health care provider before I take this medicine? They need to know if you have any of these conditions: -frequently drink alcohol -asthma -blood vessel disease or a history of a blood clot in the lungs or legs -bone disease such as osteoporosis -breast cancer -diabetes -eating disorder (anorexia nervosa or bulimia) -high blood pressure -HIV infection or AIDS -kidney disease -liver disease -mental depression -migraine -seizures (convulsions) -stroke -tobacco smoker -vaginal bleeding -an unusual or allergic reaction to medroxyprogesterone, other hormones, medicines, foods, dyes, or preservatives -pregnant or trying to get pregnant -breast-feeding How should I use this medicine? Depo-Provera Contraceptive injection is given into a muscle. Depo-subQ Provera 104 injection is given under the skin. These injections are given by a health care professional. You must not be pregnant before getting an injection. The injection is usually given during the first 5 days after the start of a menstrual period or 6 weeks after delivery of a baby. Talk to your pediatrician regarding the use of this medicine in children. Special care may be needed. These injections have been used in female children who have started having menstrual periods. Overdosage: If you think you have taken too much of this medicine contact a poison control center or emergency room at once. NOTE: This medicine is only for you. Do not share this medicine  with others. What if I miss a dose? Try not to miss a dose. You must get an injection once every 3 months to maintain birth control. If you cannot keep an appointment, call and reschedule it. If you wait longer than 13 weeks between Depo-Provera contraceptive injections or longer than 14 weeks between Depo-subQ Provera 104 injections, you could get pregnant. Use another method for birth control if you miss your appointment. You may also need a pregnancy test before receiving another injection. What may interact with this medicine? Do not take this medicine with any of the following medications: -bosentan This medicine may also interact with the following medications: -aminoglutethimide -antibiotics or medicines for infections, especially rifampin, rifabutin, rifapentine, and griseofulvin -aprepitant -barbiturate medicines such as phenobarbital or primidone -bexarotene -carbamazepine -medicines for seizures like ethotoin, felbamate, oxcarbazepine, phenytoin, topiramate -modafinil -St. John's wort This list may not describe all possible interactions. Give your health care provider a list of all the medicines, herbs, non-prescription drugs, or dietary supplements you use. Also tell them if you smoke, drink alcohol, or use illegal drugs. Some items may interact with your medicine. What should I watch for while using this medicine? This drug does not protect you against HIV infection (AIDS) or other sexually transmitted diseases. Use of this product may cause you to lose calcium from your bones. Loss of calcium may cause weak bones (osteoporosis). Only use this product for more than 2 years if other forms of birth control are not right for you. The longer you use this product for birth control the more likely you will be at risk for weak bones. Ask your health care professional how you can keep strong bones. You may have a change in bleeding pattern or irregular periods. Many females stop having    periods while taking this drug. If you have received your injections on time, your chance of being pregnant is very low. If you think you may be pregnant, see your health care professional as soon as possible. Tell your health care professional if you want to get pregnant within the next year. The effect of this medicine may last a long time after you get your last injection. What side effects may I notice from receiving this medicine? Side effects that you should report to your doctor or health care professional as soon as possible: -allergic reactions like skin rash, itching or hives, swelling of the face, lips, or tongue -breast tenderness or discharge -breathing problems -changes in vision -depression -feeling faint or lightheaded, falls -fever -pain in the abdomen, chest, groin, or leg -problems with balance, talking, walking -unusually weak or tired -yellowing of the eyes or skin Side effects that usually do not require medical attention (report to your doctor or health care professional if they continue or are bothersome): -acne -fluid retention and swelling -headache -irregular periods, spotting, or absent periods -temporary pain, itching, or skin reaction at site where injected -weight gain This list may not describe all possible side effects. Call your doctor for medical advice about side effects. You may report side effects to FDA at 1-800-FDA-1088. Where should I keep my medicine? This does not apply. The injection will be given to you by a health care professional. NOTE: This sheet is a summary. It may not cover all possible information. If you have questions about this medicine, talk to your doctor, pharmacist, or health care provider.  2018 Elsevier/Gold Standard (2008-03-24 18:37:56)   Contraception Choices Contraception, also called birth control, means things to use or ways to try not to get pregnant. Hormonal birth control This kind of birth control uses hormones.  Here are some types of hormonal birth control:  A tube that is put under skin of the arm (implant). The tube can stay in for as long as 3 years.  Shots to get every 3 months (injections).  Pills to take every day (birth control pills).  A patch to change 1 time each week for 3 weeks (birth control patch). After that, the patch is taken off for 1 week.  A ring to put in the vagina. The ring is left in for 3 weeks. Then it is taken out of the vagina for 1 week. Then a new ring is put in.  Pills to take after unprotected sex (emergency birth control pills).  Barrier birth control Here are some types of barrier birth control:  A thin covering that is put on the penis before sex (female condom). The covering is thrown away after sex.  A soft, loose covering that is put in the vagina before sex (female condom). The covering is thrown away after sex.  A rubber bowl that sits over the cervix (diaphragm). The bowl must be made for you. The bowl is put into the vagina before sex. The bowl is left in for 6-8 hours after sex. It is taken out within 24 hours.  A small, soft cup that fits over the cervix (cervical cap). The cup must be made for you. The cup can be left in for 6-8 hours after sex. It is taken out within 48 hours.  A sponge that is put into the vagina before sex. It must be left in for at least 6 hours after sex. It must be taken out within 30 hours. Then it is thrown  away.  A chemical that kills or stops sperm from getting into the uterus (spermicide). It may be a pill, cream, jelly, or foam to put in the vagina. The chemical should be used at least 10-15 minutes before sex.  IUD (intrauterine) birth control An IUD is a small, T-shaped piece of plastic. It is put inside the uterus. There are two kinds:  Hormone IUD. This kind can stay in for 3-5 years.  Copper IUD. This kind can stay in for 10 years.  Permanent birth control Here are some types of permanent birth  control:  Surgery to block the fallopian tubes.  Having an insert put into each fallopian tube.  Surgery to tie off the tubes that carry sperm (vasectomy).  Natural planning birth control Here are some types of natural planning birth control:  Not having sex on the days the woman could get pregnant.  Using a calendar: ? To keep track of the length of each period. ? To find out what days pregnancy can happen. ? To plan to not have sex on days when pregnancy can happen.  Watching for symptoms of ovulation and not having sex during ovulation. One way the woman can check for ovulation is to check her temperature.  Waiting to have sex until after ovulation.  Summary  Contraception, also called birth control, means things to use or ways to try not to get pregnant.  Hormonal methods of birth control include implants, injections, pills, patches, vaginal rings, and emergency birth control pills.  Barrier methods of birth control can include female condoms, female condoms, diaphragms, cervical caps, sponges, and spermicides.  There are two types of IUD (intrauterine device) birth control. An IUD can be put in a woman's uterus to prevent pregnancy for 3-5 years.  Permanent sterilization can be done through a procedure for males, females, or both.  Natural planning methods involve not having sex on the days when the woman could get pregnant. This information is not intended to replace advice given to you by your health care provider. Make sure you discuss any questions you have with your health care provider. Document Released: 12/29/2008 Document Revised: 03/13/2016 Document Reviewed: 03/13/2016 Elsevier Interactive Patient Education  2017 [employer]

## 2016-09-06 NOTE — Progress Notes (Signed)
Subjective:  Patient ID: Alison Farmer, female    DOB: 1970-09-17  Age: 46 y.o. MRN: [id number]  CC: Contraception and Referral (eye doctor)   HPI Alison Farmer presents for encounter of initiation of birth control. Urine pregnancy test is negative. She reports one sexual partner within the last 3 months. She denies any vaginal discharge, lesions, or dysuria. She declines any STI testing. She denies any family history of breast or gynecological cancers, she denies any family personal history of bleeding or clotting disorders or migraines. She is a non-smoker.  Blurred vision: 15 year history. Reports symptoms worsened 1 year ago. Denies any diplopia, discharge, pressure, or headaches.   Outpatient Medications Prior to Visit  Medication Sig Dispense Refill  . fexofenadine (ALLEGRA ALLERGY) 180 MG tablet Take 1 tablet (180 mg total) by mouth daily. (Patient not taking: Reported on 09/05/2016) 30 tablet 6  . fexofenadine-pseudoephedrine (ALLEGRA-D 24) 180-240 MG per 24 hr tablet Take 1 tablet by mouth daily. Instructions in spanish (has failed zyrtec and claritin) (Patient not taking: Reported on 08/06/2016) 90 tablet 1  . fluticasone (FLONASE) 50 MCG/ACT nasal spray Place 2 sprays into both nostrils daily. (Patient not taking: Reported on 09/05/2016) 16 g 6  . penicillin v potassium (VEETID) 250 MG tablet Take 1 tablet (250 mg total) by mouth every 6 (six) hours. (Patient not taking: Reported on 09/05/2016) 20 tablet 0  . ranitidine (ZANTAC 75) 75 MG tablet Take 1 tablet (75 mg total) by mouth 2 (two) times daily. (Patient not taking: Reported on 09/05/2016)     No facility-administered medications prior to visit.     ROS Review of Systems  Constitutional: Negative.   Eyes: Positive for visual disturbance (blurry vision).  Respiratory: Negative.   Cardiovascular: Negative.   Gastrointestinal: Negative.   Genitourinary: Negative.   Skin: Negative.   Psychiatric/Behavioral:  Negative.     Objective:  BP 124/72   Pulse 83   Temp 98.2 F (36.8 C) (Oral)   Resp 16   Wt 166 lb 6.4 oz (75.5 kg)   SpO2 97%   BMI 32.50 kg/m   BP/Weight 09/05/2016 08/06/2016 07/08/2013  Systolic BP 124 97 114  Diastolic BP 72 67 72  Wt. (Lbs) 166.4 162 157  BMI 32.5 31.64 30.26    Physical Exam  Constitutional: She is oriented to person, place, and time. She appears well-developed and well-nourished.  Eyes: Conjunctivae are normal. Pupils are equal, round, and reactive to light. Right eye exhibits no discharge. Left eye exhibits no discharge.  Cardiovascular: Normal rate, regular rhythm, normal heart sounds and intact distal pulses.   Pulmonary/Chest: Effort normal and breath sounds normal.  Abdominal: Soft. Bowel sounds are normal. There is no tenderness.  Neurological: She is alert and oriented to person, place, and time.  Skin: Skin is warm and dry.  Psychiatric: She has a normal mood and affect.  Nursing note and vitals reviewed.  Assessment & Plan:   Problem List Items Addressed This Visit    None    Visit Diagnoses    Encounter for initial prescription of injectable contraceptive    -  Primary   Relevant Medications   medroxyPROGESTERone (DEPO-PROVERA) injection 150 mg (Completed)   Other Relevant Orders   POCT urine pregnancy (Completed)   Blurred vision, bilateral       Vision screen   Relevant Orders   POCT glycosylated hemoglobin (Hb A1C) (Completed)      Meds ordered this encounter  Medications  . medroxyPROGESTERone (DEPO-PROVERA)  injection 150 mg    Follow-up: Return in about 3 months (around 12/06/2016) for Nurse Visit Depo Provera.   Lizbeth Bark FNP

## 2016-10-06 ENCOUNTER — Ambulatory Visit (INDEPENDENT_AMBULATORY_CARE_PROVIDER_SITE_OTHER): Payer: No Typology Code available for payment source | Admitting: Allergy and Immunology

## 2016-10-06 ENCOUNTER — Encounter: Payer: Self-pay | Admitting: Allergy and Immunology

## 2016-10-06 VITALS — BP 116/78 | HR 72 | Temp 98.1°F | Resp 16 | Ht 61.0 in | Wt 165.6 lb

## 2016-10-06 DIAGNOSIS — T7800XD Anaphylactic reaction due to unspecified food, subsequent encounter: Secondary | ICD-10-CM

## 2016-10-06 DIAGNOSIS — H1010 Acute atopic conjunctivitis, unspecified eye: Secondary | ICD-10-CM | POA: Insufficient documentation

## 2016-10-06 DIAGNOSIS — K146 Glossodynia: Secondary | ICD-10-CM

## 2016-10-06 DIAGNOSIS — J3089 Other allergic rhinitis: Secondary | ICD-10-CM

## 2016-10-06 DIAGNOSIS — H1013 Acute atopic conjunctivitis, bilateral: Secondary | ICD-10-CM

## 2016-10-06 DIAGNOSIS — T781XXA Other adverse food reactions, not elsewhere classified, initial encounter: Secondary | ICD-10-CM

## 2016-10-06 DIAGNOSIS — T7800XA Anaphylactic reaction due to unspecified food, initial encounter: Secondary | ICD-10-CM | POA: Insufficient documentation

## 2016-10-06 MED ORDER — OLOPATADINE HCL 0.2 % OP SOLN: 1.0000 [drp] | Freq: Every day | OPHTHALMIC | 5 refills | Status: DC

## 2016-10-06 MED ORDER — EPINEPHRINE 0.3 MG/0.3ML IJ SOAJ
INTRAMUSCULAR | 1 refills | Status: DC
Start: 2016-10-06 — End: 2017-12-14

## 2016-10-06 MED ORDER — LEVOCETIRIZINE DIHYDROCHLORIDE 5 MG PO TABS: 5.0000 mg | ORAL_TABLET | Freq: Every evening | ORAL | 5 refills | Status: DC

## 2016-10-06 NOTE — Patient Instructions (Addendum)
Food allergy The patient's history suggests food allergy and positive skin test results today confirm this diagnosis.  Meticulous avoidance of tree nuts and seeds as discussed.  Oral pruritus associated with the consumption of avocado most likely represents oral allergy syndrome.  However, it is recommended to avoid any food causing untoward symptoms.  A prescription has been provided for epinephrine auto-injector 2 pack along with instructions for proper administration.  A food allergy action plan has been provided and discussed.  Medic Alert identification is recommended.  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  May be secondary to nutrient deficiency, i.e. iron, zinc, folate, thiamine, riboflavin, pyridoxine, and/or cobalamin.  I will defer further evaluation to primary care physician or gastroenterology.  Allergic rhinitis  Aeroallergen avoidance measures have been discussed and provided in written form.  A prescription has been provided for levocetirizine, 5mg  daily as needed.  Continue fluticasone nasal spray, 2 sprays per nostril daily as needed.  I have also recommended nasal saline spray (i.e., Simply Saline) or nasal saline lavage (i.e., NeilMed) as needed and prior to medicated nasal sprays.  For thick post nasal drainage, add guaifenesin [phone] mg (Mucinex)  twice daily as needed with adequate hydration as discussed.  If allergen avoidance measures and medications fail to adequately relieve symptoms, aeroallergen immunotherapy will be considered.  Allergic conjunctivitis  Treatment plan as outlined above for allergic rhinitis.  A prescription has been provided for Pataday, one drop per eye daily as needed.  I have also recommended eye lubricant drops (i.e., Natural Tears) as needed.   Return in about 6 months (around 04/08/2017), or if symptoms worsen or fail to improve.  Reducing Pollen Exposure  The American  Academy of All[employer], Asthma and Immun[employer] suggests the following steps to reduce your exposure to pollen during allergy seasons.    1. Do not hang sheets or clothing out to dry; pollen may collect on these items. 2. Do not mow lawns or spend time around freshly cut grass; mowing stirs up pollen. 3. Keep windows closed at night.  Keep car windows closed while driving. 4. Minimize morning activities outdoors, a time when pollen counts are usually at their highest. 5. Stay indoors as much as possible when pollen counts or humidity is high and on windy days when pollen tends to remain in the air longer. 6. Use air conditioning when possible.  Many air conditioners have filters that trap the pollen spores. 7. Use a HEPA room air filter to remove pollen form the indoor air you breathe.   Control of Mold Allergen  Mold and fungi can grow on a variety of surfaces provided certain temperature and moisture conditions exist.  Outdoor molds grow on plants, decaying vegetation and soil.  The major outdoor mold, Alternaria and Cladosporium, are found in very high numbers during hot and dry conditions.  Generally, a late Summer - Fall peak is seen for common outdoor fungal spores.  Rain will temporarily lower outdoor mold spore count, but counts rise rapidly when the rainy period ends.  The most important indoor molds are Aspergillus and Penicillium.  Dark, humid and poorly ventilated basements are ideal sites for mold growth.  The next most common sites of mold growth are the bathroom and the kitchen.  Outdoor MicrosoftMold Control 1. Use air conditioning and keep windows closed 2. Avoid exposure to decaying vegetation. 3. Avoid leaf raking. 4. Avoid grain handling. 5. Consider wearing a face mask if working in [place]  areas.  Indoor Mold Control 1. Maintain humidity below 50%. 2. Clean washable surfaces with 5% bleach solution. 3. Remove sources e.g. Contaminated carpets.  Control of Cockroach  Allergen  Cockroach allergen has been identified as an important cause of acute attacks of asthma, especially in urban settings.  There are fifty-five species of cockroach that exist in the [place], however only three, the [place], [place] species produce allergen that can affect patients with Asthma.  Allergens can be obtained from fecal particles, egg casings and secretions from cockroaches.    1. Remove food sources. 2. Reduce access to water. 3. Seal access and entry points. 4. Spray runways with 0.5-1% Diazinon or Chlorpyrifos 5. Blow boric acid power under stoves and refrigerator. 6. Place bait stations (hydramethylnon) at feeding sites.   Oral Allergy Syndrome (OAS)  Oral Allergy Syndrome or OAS is an allergic reaction to certain (usually fresh) fruits, nuts, and vegetables. The allergy is not actually an allergy to food but a syndrome that develops in pollen allergy sufferers. The immune system mistakes the food proteins for the pollen proteins and causes an allergic reaction. For instance, an allergy to ragweed is associated with OAS reactions to banana, watermelon, cantaloupe, honeydew, zucchini, and cucumber. This does not mean that all sufferers of an allergy to ragweed will experience adverse effects from all or even any of these foods. Reactions may begin with one type of food and with reactions to others developing later. However, reaction to one or more foods in any given category does not necessarily mean a person is allergic to all foods in that group. OAS sufferers may have a number of reactions that usually occur very rapidly, within minutes of eating a trigger food. The most common reaction is an itching or burning sensation in the lips, mouth, and/or pharynx. Sometimes other reactions can be triggered in the eyes, nose, and skin. The most severe reactions can result in asthma problems or anaphylaxis.  If a sufferer is able to swallow the food, there is a good  chance that there will be a reaction later in the gastrointestinal tract. Vomiting, diarrhea, severe indigestion, or cramps may occur.  Treatment: An OAS sufferer should avoid foods to which they are allergic. Peeling or cooking the food has shown to reduce symptoms in the throat and mouth, but may not relieve symptoms in the gastrointestinal tract. Antihistamines may also relieve the symptoms of the allergy. Persons with severe reactions may consider carrying injectable epinephrine should systemic symptoms occur. Allergy immunotherapy to the pollens has improved or cured OAS in many patients, though this has not been consistent for all patients. Laban Emperor pollen: almonds, apples, celery, cherries, hazel nuts, peaches, pears, parsley, strawberry, raspberry . Birch pollen: almonds, apples, apricots, avocados, bananas, carrots, celery, cherries, chicory, coriander, fennel, fig, hazel nuts, kiwifruit, nectarines, parsley, parsnips, peaches, pears, peppers, plums, potatoes, prunes, soy, strawberries, wheat; Potential: walnuts . Grass pollen: fig, melons, tomatoes, oranges . Mugwort pollen : carrots, celery, coriander, fennel, parsley, peppers, sunflower . Ragweed pollen : banana, cantaloupe, cucumber, green pepper, paprika, sunflower seeds/oil, honeydew, watermelon, zucchini, echinacea, artichoke, dandelions, honey (if bees pollinate from wild flowers), hibiscus or chamomile tea . Possible cross-reactions (to any of the above): berries (strawberries, blueberries, raspberries, etc), citrus (oranges, lemons, etc), grapes, mango, figs, peanut, pineapple, pomegranates, watermelon

## 2016-10-06 NOTE — Assessment & Plan Note (Signed)
   The history does not suggest that this is an allergy related issue.  May be secondary to nutrient deficiency, i.e. iron, zinc, folate, thiamine, riboflavin, pyridoxine, and/or cobalamin.  I will defer further evaluation to primary care physician or gastroenterology.
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
## 2016-10-06 NOTE — Assessment & Plan Note (Signed)
   Treatment plan as outlined above for allergic rhinitis.  A prescription has been provided for Pataday, one drop per eye daily as needed.  I have also recommended eye lubricant drops (i.e., Natural Tears) as needed. 

## 2016-10-06 NOTE — Assessment & Plan Note (Signed)
The patient's history suggests food allergy and positive skin test results today confirm this diagnosis.  Meticulous avoidance of tree nuts and seeds as discussed.  Oral pruritus associated with the consumption of avocado most likely represents oral allergy syndrome.  However, it is recommended to avoid any food causing untoward symptoms.  A prescription has been provided for epinephrine auto-injector 2 pack along with instructions for proper administration.  A food allergy action plan has been provided and discussed.  Medic Alert identification is recommended.

## 2016-10-06 NOTE — Assessment & Plan Note (Signed)
   Aeroallergen avoidance measures have been discussed and provided in written form.  A prescription has been provided for levocetirizine, 5mg  daily as needed.  Continue fluticasone nasal spray, 2 sprays per nostril daily as needed.  I have also recommended nasal saline spray (i.e., Simply Saline) or nasal saline lavage (i.e., NeilMed) as needed and prior to medicated nasal sprays.  For thick post nasal drainage, add guaifenesin [phone] mg (Mucinex)  twice daily as needed with adequate hydration as discussed.  If allergen avoidance measures and medications fail to adequately relieve symptoms, aeroallergen immunotherapy will be considered.

## 2016-11-26 ENCOUNTER — Ambulatory Visit: Payer: Self-pay

## 2016-12-25 ENCOUNTER — Ambulatory Visit: Payer: Self-pay

## 2016-12-26 ENCOUNTER — Ambulatory Visit: Payer: Self-pay | Attending: Family Medicine | Admitting: *Deleted

## 2016-12-26 DIAGNOSIS — Z3042 Encounter for surveillance of injectable contraceptive: Secondary | ICD-10-CM

## 2016-12-26 DIAGNOSIS — J302 Other seasonal allergic rhinitis: Secondary | ICD-10-CM

## 2016-12-26 DIAGNOSIS — J3089 Other allergic rhinitis: Secondary | ICD-10-CM

## 2016-12-26 MED ORDER — MEDROXYPROGESTERONE ACETATE 150 MG/ML IM SUSP
150.0000 mg | Freq: Once | INTRAMUSCULAR | Status: AC
  Administered 2016-12-26: 150 mg via INTRAMUSCULAR

## 2016-12-26 MED ORDER — FEXOFENADINE HCL 180 MG PO TABS: 180.0000 mg | ORAL_TABLET | Freq: Every day | ORAL | 5 refills | Status: DC

## 2016-12-26 NOTE — Progress Notes (Signed)
Date last pap:  Last Depo-Provera: 09/05/2016 . Side Effects if any: n/a. HCG indicated? negative. Depo-Provera 150 mg IM given by T.Tashe Purdon,RN Next appointment due Dec.28-Jan 11.

## 2017-03-13 ENCOUNTER — Ambulatory Visit: Payer: Self-pay | Attending: Family Medicine | Admitting: *Deleted

## 2017-03-13 DIAGNOSIS — Z3042 Encounter for surveillance of injectable contraceptive: Secondary | ICD-10-CM | POA: Insufficient documentation

## 2017-03-13 MED ORDER — MEDROXYPROGESTERONE ACETATE 150 MG/ML IM SUSP
150.0000 mg | Freq: Once | INTRAMUSCULAR | Status: AC
  Administered 2017-03-13: 150 mg via INTRAMUSCULAR

## 2017-03-13 NOTE — Progress Notes (Signed)
Date last pap:  09/25/2009 Last Depo-Provera: 09/05/2016 . Side Effects if any: n/a. HCG indicated? no Depo-Provera 150 mg IM given by T.Dylana Shaw,RN in [facility]. Next appointment due March 15-29. Pt needs PAP

## 2017-05-15 ENCOUNTER — Ambulatory Visit: Payer: Self-pay | Attending: Internal Medicine

## 2017-05-29 ENCOUNTER — Ambulatory Visit: Payer: Self-pay

## 2017-06-11 ENCOUNTER — Ambulatory Visit: Payer: Self-pay | Attending: Internal Medicine | Admitting: *Deleted

## 2017-06-11 DIAGNOSIS — Z3042 Encounter for surveillance of injectable contraceptive: Secondary | ICD-10-CM | POA: Insufficient documentation

## 2017-06-11 MED ORDER — MEDROXYPROGESTERONE ACETATE 150 MG/ML IM SUSP
150.0000 mg | Freq: Once | INTRAMUSCULAR | Status: AC
  Administered 2017-06-11: 150 mg via INTRAMUSCULAR

## 2017-06-11 NOTE — Progress Notes (Signed)
Date last pap: pt advised to establish care. Last Depo-Provera: 03/13/2018. Side Effects if any: n/a. Serum HCG indicated? n/a. Depo-Provera 150 mg IM given by in right upper outer quadrant. Next appointment due 06/13/-06/27  .

## 2017-07-17 ENCOUNTER — Ambulatory Visit: Payer: Self-pay | Attending: Internal Medicine | Admitting: Internal Medicine

## 2017-07-17 ENCOUNTER — Encounter: Payer: Self-pay | Admitting: Internal Medicine

## 2017-07-17 VITALS — BP 98/66 | HR 99 | Temp 98.3°F | Resp 18 | Ht 60.0 in | Wt 162.0 lb

## 2017-07-17 DIAGNOSIS — Z79899 Other long term (current) drug therapy: Secondary | ICD-10-CM | POA: Insufficient documentation

## 2017-07-17 DIAGNOSIS — J301 Allergic rhinitis due to pollen: Secondary | ICD-10-CM | POA: Insufficient documentation

## 2017-07-17 DIAGNOSIS — Z91018 Allergy to other foods: Secondary | ICD-10-CM | POA: Insufficient documentation

## 2017-07-17 DIAGNOSIS — Z8249 Family history of ischemic heart disease and other diseases of the circulatory system: Secondary | ICD-10-CM | POA: Insufficient documentation

## 2017-07-17 DIAGNOSIS — R300 Dysuria: Secondary | ICD-10-CM | POA: Insufficient documentation

## 2017-07-17 DIAGNOSIS — Z124 Encounter for screening for malignant neoplasm of cervix: Secondary | ICD-10-CM | POA: Insufficient documentation

## 2017-07-17 DIAGNOSIS — H5789 Other specified disorders of eye and adnexa: Secondary | ICD-10-CM | POA: Insufficient documentation

## 2017-07-17 MED ORDER — LORATADINE 10 MG PO TABS: 10.0000 mg | ORAL_TABLET | Freq: Every day | ORAL | 1 refills | Status: DC

## 2017-07-17 MED ORDER — SULFAMETHOXAZOLE-TRIMETHOPRIM 400-80 MG PO TABS: 1.0000 | ORAL_TABLET | Freq: Two times a day (BID) | ORAL | 0 refills | Status: DC
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
## 2017-07-20 NOTE — Addendum Note (Signed)
Addended by: Jonah Blue B on: 07/20/2017 04:51 PM   Modules accepted: Orders

## 2017-07-21 LAB — CERVICOVAGINAL ANCILLARY ONLY
CHLAMYDIA, DNA PROBE: NEGATIVE
Neisseria Gonorrhea: NEGATIVE
Trichomonas: NEGATIVE

## 2017-07-21 LAB — CYTOLOGY - PAP
Diagnosis: NEGATIVE
HPV: NOT DETECTED

## 2017-07-24 ENCOUNTER — Telehealth: Payer: Self-pay

## 2017-07-24 NOTE — Telephone Encounter (Signed)
-----   Message from Jay'A R Pollock, RMA sent at 07/24/2017  3:51 PM EDT -----   ----- Message ----- From: Johnson, Deborah B, MD Sent: 07/22/2017   8:20 AM To: Jay'A R Pollock, RMA  Let patient know that her Pap smear was normal.  She will need a repeat Pap in 5 years. 

## 2017-07-24 NOTE — Telephone Encounter (Signed)
-----   Message from Particia Latherock, [place] sent at 07/24/2017  3:51 PM EDT -----   ----- Message ----- From: Marcine Matar, MD Sent: 07/22/2017   8:20 AM To: Particia Lather, RMA  Let patient know that her Pap smear was normal.  She will need a repeat Pap in 5 years.

## 2017-07-24 NOTE — Telephone Encounter (Signed)
Patient call back regarding VM    CMA inform about results    Patient was aware and understood

## 2017-07-24 NOTE — Telephone Encounter (Signed)
CMA call patient regarding lab results   Patient husband answer left a Vm stating the reason of the call & to call me back

## 2017-08-07 ENCOUNTER — Encounter (HOSPITAL_BASED_OUTPATIENT_CLINIC_OR_DEPARTMENT_OTHER): Payer: Self-pay

## 2017-08-07 ENCOUNTER — Other Ambulatory Visit: Payer: Self-pay

## 2017-08-07 ENCOUNTER — Emergency Department (HOSPITAL_BASED_OUTPATIENT_CLINIC_OR_DEPARTMENT_OTHER)
Admission: EM | Admit: 2017-08-07 | Discharge: 2017-08-08 | Disposition: A | Discharge status: Home / Self Care | Payer: Self-pay | Attending: Emergency Medicine | Admitting: Emergency Medicine

## 2017-08-07 DIAGNOSIS — R509 Fever, unspecified: Secondary | ICD-10-CM

## 2017-08-07 DIAGNOSIS — N3000 Acute cystitis without hematuria: Secondary | ICD-10-CM | POA: Insufficient documentation

## 2017-08-07 DIAGNOSIS — E860 Dehydration: Secondary | ICD-10-CM | POA: Insufficient documentation

## 2017-08-07 DIAGNOSIS — Z79899 Other long term (current) drug therapy: Secondary | ICD-10-CM | POA: Insufficient documentation

## 2017-08-07 DIAGNOSIS — N3001 Acute cystitis with hematuria: Secondary | ICD-10-CM

## 2017-08-07 LAB — URINALYSIS, ROUTINE W REFLEX MICROSCOPIC
BILIRUBIN URINE: NEGATIVE
Glucose, UA: NEGATIVE mg/dL
KETONES UR: NEGATIVE mg/dL
NITRITE: NEGATIVE
Protein, ur: NEGATIVE mg/dL
Specific Gravity, Urine: 1.01 (ref 1.005–1.030)
pH: 7.5 (ref 5.0–8.0)

## 2017-08-07 LAB — URINALYSIS, MICROSCOPIC (REFLEX)

## 2017-08-07 LAB — PREGNANCY, URINE: Preg Test, Ur: NEGATIVE

## 2017-08-07 NOTE — ED Triage Notes (Addendum)
C/o fever x 9 days-denies n/v/d, resp sx-states she is was dx with UTI 2 weeks ago and is taking abx-NAD-steady gait

## 2017-08-08 ENCOUNTER — Emergency Department (HOSPITAL_BASED_OUTPATIENT_CLINIC_OR_DEPARTMENT_OTHER): Payer: Self-pay

## 2017-08-08 LAB — CBC
HCT: 31.2 % — ABNORMAL LOW (ref 36.0–46.0)
Hemoglobin: 10.7 g/dL — ABNORMAL LOW (ref 12.0–15.0)
MCH: 26.8 pg (ref 26.0–34.0)
MCHC: 34.3 g/dL (ref 30.0–36.0)
MCV: 78.2 fL (ref 78.0–100.0)
Platelets: 312 K/uL (ref 150–400)
RBC: 3.99 MIL/uL (ref 3.87–5.11)
RDW: 13.5 % (ref 11.5–15.5)
WBC: 9.8 K/uL (ref 4.0–10.5)

## 2017-08-08 LAB — COMPREHENSIVE METABOLIC PANEL
ALK PHOS: 92 U/L (ref 38–126)
ALT: 41 U/L (ref 14–54)
ANION GAP: 11 (ref 5–15)
AST: 36 U/L (ref 15–41)
Albumin: 3.4 g/dL — ABNORMAL LOW (ref 3.5–5.0)
BUN: <5 mg/dL — ABNORMAL LOW (ref 6–20)
CALCIUM: 8.8 mg/dL — AB (ref 8.9–10.3)
CHLORIDE: 101 mmol/L (ref 101–111)
CO2: 22 mmol/L (ref 22–32)
Creatinine, Ser: 0.6 mg/dL (ref 0.44–1.00)
GFR calc Af Amer: >60 mL/min (ref >60)
GFR calc non Af Amer: >60 mL/min (ref >60)
Glucose, Bld: 134 mg/dL — ABNORMAL HIGH (ref 65–99)
POTASSIUM: 3.5 mmol/L (ref 3.5–5.1)
SODIUM: 134 mmol/L — AB (ref 135–145)
Total Bilirubin: 0.2 mg/dL — ABNORMAL LOW (ref 0.3–1.2)
Total Protein: 7.7 g/dL (ref 6.5–8.1)

## 2017-08-08 LAB — LIPASE, BLOOD: Lipase: 31 U/L (ref 11–51)

## 2017-08-08 MED ORDER — IBUPROFEN 400 MG PO TABS
600.0000 mg | ORAL_TABLET | Freq: Once | ORAL | Status: AC
  Administered 2017-08-08: 600 mg via ORAL
  Filled 2017-08-08: qty 1

## 2017-08-08 MED ORDER — CEPHALEXIN 500 MG PO CAPS: 500.0000 mg | ORAL_CAPSULE | Freq: Three times a day (TID) | ORAL | 0 refills | Status: DC

## 2017-08-08 MED ORDER — CEPHALEXIN 250 MG PO CAPS
500.0000 mg | ORAL_CAPSULE | Freq: Once | ORAL | Status: AC
  Administered 2017-08-08: 500 mg via ORAL
  Filled 2017-08-08: qty 2

## 2017-08-08 MED ORDER — SODIUM CHLORIDE 0.9 % IV BOLUS
1500.0000 mL | Freq: Once | INTRAVENOUS | Status: AC
  Administered 2017-08-08: 1500 mL via INTRAVENOUS

## 2017-08-08 NOTE — ED Notes (Signed)
ED Provider at bedside. 

## 2017-08-09 LAB — URINE CULTURE

## 2017-08-11 NOTE — ED Provider Notes (Signed)
[facility] EMERGENCY DEPARTMENT Provider Note   CSN: [id number] Arrival date & time: 08/07/17  2231     History   Chief Complaint Chief Complaint  Patient presents with  . Fever    HPI Alison Farmer is a 47 y.o. female.  HPI Patient is a 47 year old female who reports fever over the past 9 days with associated intermittent upper respiratory symptoms.  She is currently on antibiotic which she cannot remember the name.  She denies chest pain shortness of breath.  Denies nausea vomiting diarrhea.  No abdominal pain.  No urinary complaints.  Reports mild decreased oral intake and generalized fatigue.  She reports she was started on antibiotics for UTI originally.  Some nasal congestion.  No recent sick contacts.  No rash.  No headache or nuchal rigidity.  Symptoms are moderate in severity.  No other complaints.   Past Medical History:  Diagnosis Date  . Anxiety associated with depression   . Hyperpigmentation    of tongue  . Seasonal allergies     Patient Active Problem List   Diagnosis Date Noted  . Food allergy 10/06/2016  . Oral allergy syndrome, initial encounter 10/06/2016  . Allergic conjunctivitis 10/06/2016  . Spider varicose veins 05/21/2010  . Skin nodule 05/21/2010  . 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 BACK PAIN, LUMBAR 01/04/2007  . SYMPTOM, INCONTINENCE, MIXED, URGE/STRESS 01/04/2007  . ANXIETY 05/14/2006  . Allergic rhinitis 05/14/2006    Past Surgical History:  Procedure Laterality Date  . CESAREAN SECTION    . HYSTEROSCOPY  06/15/09   IUD removal     OB History   None      Home Medications    Prior to Admission medications   Medication Sig Start Date End Date Taking? Authorizing Provider  cephALEXin (KEFLEX) 500 MG capsule Take 1 capsule (500 mg total) by mouth 3 (three) times daily. 08/08/17   Azalia Bilis, MD  EPINEPHrine (EPIPEN 2-PAK) 0.3 mg/0.3 mL IJ SOAJ injection Use as directed for severe allergic reaction Patient not taking:  Reported on 07/17/2017 10/06/16   Bobbitt, Heywood Iles, MD  fexofenadine [facility] ALLERGY) 180 MG tablet Take 1 tablet (180 mg total) by mouth daily. Patient not taking: Reported on 07/17/2017 12/26/16   Lizbeth Bark, FNP  fluticasone (FLONASE) 50 MCG/ACT nasal spray Place 2 sprays into both nostrils daily. Patient not taking: Reported on 07/17/2017 08/06/16   Lizbeth Bark, FNP  levocetirizine (XYZAL) 5 MG tablet Take 1 tablet (5 mg total) by mouth every evening. Patient not taking: Reported on 12/26/2016 10/06/16   Bobbitt, Heywood Iles, MD  loratadine (CLARITIN) 10 MG tablet Take 1 tablet (10 mg total) by mouth daily. 07/17/17   Marcine Matar, MD  Olopatadine HCl 0.2 % SOLN Apply 1 drop to eye daily. Patient not taking: Reported on 12/26/2016 10/06/16   Bobbitt, Heywood Iles, MD  ranitidine (ZANTAC 75) 75 MG tablet Take 1 tablet (75 mg total) by mouth 2 (two) times daily. Patient not taking: Reported on 09/05/2016 08/06/16   Lizbeth Bark, FNP  sulfamethoxazole-trimethoprim (BACTRIM) 400-80 MG tablet Take 1 tablet by mouth 2 (two) times daily. 07/17/17   Marcine Matar, MD    Family History Family History  Problem Relation Age of Onset  . Heart disease Mother 3       often fainted, had palpitations  . Heart disease Father   . Heart disease Sister        younger sister with palpitations/heart skipping beats requiring hospitalizations and  meds  . Allergic rhinitis Neg Hx   . Angioedema Neg Hx   . Eczema Neg Hx   . Asthma Neg Hx     Social History Social History   Tobacco Use  . Smoking status: Never Smoker  . Smokeless tobacco: Never Used  Substance Use Topics  . Alcohol use: No  . Drug use: No     Allergies   Avocado   Review of Systems Review of Systems  All other systems reviewed and are negative.    Physical Exam Updated Vital Signs BP 100/64   Pulse 92   Temp 99.9 F (37.7 C)   Resp 20   Ht  (1.549 m)   Wt 72.5 kg (159 lb 12.8 oz)    SpO2 100%   BMI 30.19 kg/m   Physical Exam  Constitutional: She is oriented to person, place, and time. She appears well-developed and well-nourished. No distress.  HENT:  Head: Normocephalic and atraumatic.  Posterior pharynx is normal.  Eyes: EOM are normal.  Neck: Normal range of motion.  Cardiovascular: Normal rate, regular rhythm and normal heart sounds.  Pulmonary/Chest: Effort normal and breath sounds normal.  Abdominal: Soft. She exhibits no distension. There is no tenderness.  Musculoskeletal: Normal range of motion.  Neurological: She is alert and oriented to person, place, and time.  Skin: Skin is warm and dry. No rash noted.  Psychiatric: She has a normal mood and affect. Judgment normal.  Nursing note and vitals reviewed.    ED Treatments / Results  Labs (all labs ordered are listed, but only abnormal results are displayed) Labs Reviewed  URINE CULTURE - Abnormal; Notable for the following components:      Result Value   Culture MULTIPLE SPECIES PRESENT, SUGGEST RECOLLECTION (*)    All other components within normal limits  URINALYSIS, ROUTINE W REFLEX MICROSCOPIC - Abnormal; Notable for the following components:   Hgb urine dipstick SMALL (*)    Leukocytes, UA SMALL (*)    All other components within normal limits  URINALYSIS, MICROSCOPIC (REFLEX) - Abnormal; Notable for the following components:   Bacteria, UA RARE (*)    All other components within normal limits  CBC - Abnormal; Notable for the following components:   Hemoglobin 10.7 (*)    HCT 31.2 (*)    All other components within normal limits  COMPREHENSIVE METABOLIC PANEL - Abnormal; Notable for the following components:   Sodium 134 (*)    Glucose, Bld 134 (*)    BUN <5 (*)    Calcium 8.8 (*)    Albumin 3.4 (*)    Total Bilirubin 0.2 (*)    All other components within normal limits  CULTURE, BLOOD (ROUTINE X 2)  CULTURE, BLOOD (ROUTINE X 2)  PREGNANCY, URINE  LIPASE, BLOOD     EKG None  Radiology No results found.  Procedures Procedures (including critical care time)  Medications Ordered in ED Medications  sodium chloride 0.9 % bolus 1,500 mL (0 mLs Intravenous Stopped 08/08/17 0239)  ibuprofen (ADVIL,MOTRIN) tablet 600 mg (600 mg Oral Given 08/08/17 0025)  cephALEXin (KEFLEX) capsule 500 mg (500 mg Oral Given 08/08/17 0311)     Initial Impression / Assessment and Plan / ED Course  I have reviewed the triage vital signs and the nursing notes.  Pertinent labs & imaging results that were available during my care of the patient were reviewed by me and considered in my medical decision making (see chart for details).  Patient is overall well-appearing.  Her abdominal exam is benign.  Posterior pharynx is normal.  Chest x-ray personally reviewed by myself demonstrates no acute abnormality.  Questionable UTI however low suspicion but no other clear source.  Patient will be placed on Keflex.  Urine culture sent.  Primary care follow-up.  Hydrated in the emergency department and feels better at this time.  Tachycardia resolved.  Doubt sepsis.  Blood cultures obtained and pending.  Final Clinical Impressions(s) / ED Diagnoses   Final diagnoses:  Fever, unspecified fever cause  Acute cystitis with hematuria  Acute dehydration    ED Discharge Orders        Ordered    cephALEXin (KEFLEX) 500 MG capsule  3 times daily     08/08/17 0258       Azalia Bilis, MD 08/11/17 1256

## 2017-08-13 LAB — CULTURE, BLOOD (ROUTINE X 2)
Culture: NO GROWTH
Culture: NO GROWTH
Special Requests: ADEQUATE
Special Requests: ADEQUATE

## 2017-10-01 ENCOUNTER — Ambulatory Visit: Payer: Self-pay | Attending: Family Medicine | Admitting: Physician Assistant

## 2017-10-01 VITALS — BP 105/70 | HR 77 | Temp 98.2°F | Resp 18 | Ht 61.0 in | Wt 160.0 lb

## 2017-10-01 DIAGNOSIS — Z3042 Encounter for surveillance of injectable contraceptive: Secondary | ICD-10-CM | POA: Insufficient documentation

## 2017-10-01 DIAGNOSIS — Z91018 Allergy to other foods: Secondary | ICD-10-CM | POA: Insufficient documentation

## 2017-10-01 DIAGNOSIS — R739 Hyperglycemia, unspecified: Secondary | ICD-10-CM | POA: Insufficient documentation

## 2017-10-01 DIAGNOSIS — R1084 Generalized abdominal pain: Secondary | ICD-10-CM | POA: Insufficient documentation

## 2017-10-01 LAB — POCT URINALYSIS DIPSTICK
Bilirubin, UA: NEGATIVE
Glucose, UA: NEGATIVE
Ketones, UA: NEGATIVE
LEUKOCYTES UA: NEGATIVE
NITRITE UA: NEGATIVE
PROTEIN UA: NEGATIVE
SPEC GRAV UA: 1.015 (ref 1.010–1.025)
UROBILINOGEN UA: 0.2 U/dL
pH, UA: 5.5 (ref 5.0–8.0)

## 2017-10-01 LAB — POCT URINE PREGNANCY: PREG TEST UR: NEGATIVE

## 2017-10-01 MED ORDER — MEDROXYPROGESTERONE ACETATE 150 MG/ML IM SUSP
150.0000 mg | Freq: Once | INTRAMUSCULAR | Status: AC
  Administered 2017-10-01: 150 mg via INTRAMUSCULAR

## 2017-10-01 NOTE — Patient Instructions (Addendum)
Drink more water  Hiperglucemia (Hyperglycemia) La hiperglucemia se produce cuando el nivel de azcar (glucosa) en la sangre es demasiado alto. Algunos de los sntomas de nivel alto de azcar en la sangre son los siguientes:  Sentir que tiene lo siguiente: ? Estar ms sediento que lo habitual. ? Sentirse cansado. ? Estar dbil.  Tener lo siguiente: ? La boca seca. ? Cambios en la visin, como visin borrosa. ? Aliento con Charles Schwabolor a fruta. ? Aumento o prdida de peso no planificados. Probablemente pierda de peso muy rpidamente. ? Hormigueo o adormecimiento en las manos o los pies. ? Dolor de Turkmenistancabeza. ? Cuando se pellizca la piel, esta no vuelve rpidamente a su lugar al soltarla. ? Dolor en el vientre (abdomen). ? Cortes o hematomas que tardan en curarse.  Orinar con mayor frecuencia que lo normal.  Tener ms hambre de lo habitual.  Tener menos deseos de comer que lo habitual (prdida del apetito). La hiperglucemia le puede ocurrir tanto a las personas que tienen diabetes como a las que no la tienen. Puede desarrollarse despacio o rpidamente y ser una emergencia mdica. CUIDADOS EN EL HOGAR  Si tiene diabetes, siga su plan de control de la diabetes. Haga lo siguiente: ? Anadarko Petroleum Corporationome los medicamentos segn las indicaciones. ? Siga el plan de ejercicio. ? Siga el plan de comidas. ? Controle su nivel de azcar en la sangre peridicamente. ? Controle su nivel de azcar en la sangre antes y despus de ejercitarse. Si hace ejercicio durante ms tiempo o de Abbott Laboratoriesmanera diferente de lo habitual, asegrese de Chief Operating Officercontrolar su nivel de azcar en la sangre con mayor frecuencia. ? Use su pulsera de alerta mdica, que indica que usted tiene diabetes.  Beba suficiente lquido para mantener el pis claro o de color amarillo plido.  Mantenga un peso saludable con dieta y ejercicios. Pregntele a su mdico cul es su peso ideal.  No consuma ningn producto que contenga tabaco, lo que incluye cigarrillos, tabaco  de Theatre managermascar y Administrator, Civil Servicecigarrillos electrnicos. Si necesita ayuda para dejar de fumar, consulte al mdico.  Limite el consumo de alcohol a no ms de 1medida por da si es mujer y no est embarazada y a 2medidas por da si es hombre. Una medida equivale a 12onzas de cerveza, 5onzas de vino o 1onzas de bebidas alcohlicas de alta graduacin.  Concurra a todas las visitas de control como se lo haya indicado el mdico. Esto es importante.  SOLICITE AYUDA SI:  Su nivel de azcar en la sangre est por encima del rango indicado en dos mediciones seguidas.  Tiene episodios frecuentes hiperglucemia.  SOLICITE AYUDA DE INMEDIATO SI:  Tiene dificultad para respirar.  Tiene cambios en la manera se sentirse, pensar o actuar (estado mental).  Tiene ganas constantes de vomitar (nuseas).  No puede dejar de vomitar. Estos sntomas pueden Customer service managerindicar una emergencia. No espere hasta que los sntomas desaparezcan. Solicite atencin mdica de inmediato. Comunquese con el servicio de emergencias de su localidad (911 en los [place]). No conduzca por sus propios medios OfficeMax Incorporatedhasta el hospital. Esta informacin no tiene Theme park managercomo fin reemplazar el consejo del mdico. Asegrese de hacerle al mdico cualquier pregunta que tenga. Document Released: 04/05/2010 Document Revised: 06/25/2015 Document Reviewed: 11/07/2014 Elsevier Interactive Patient Education  2017 [employer]

## 2017-10-01 NOTE — Progress Notes (Signed)
Patient ID: Alison Farmer, female   DOB: [birth year], 47 y.o.   MRN: [id number]      Alison Farmer, is a 47 y.o. female  [id number]  [id number]  DOB - [birth year]  Subjective:  Chief Complaint and HPI: Alison Farmer is a 47 y.o. female here today for depo shot and because she had abdominal pain about 2 weeks ago that has started improving.  She describes the pain as "burning" in her RUQ pain that has been coming and going.  No N/V/D/C.  Appetite is good and pain is improving.  No f/c.  No urinary s/sx.  A few weeks late for Depo shot.    Upon lab review, hyperglycemia noted.    ROS:   Constitutional:  No f/c, No night sweats, No unexplained weight loss. EENT:  No vision changes, No blurry vision, No hearing changes. No mouth, throat, or ear problems.  Respiratory: No cough, No SOB Cardiac: No CP, no palpitations GI:  No abd pain now, No N/V/D. GU: No Urinary s/sx Musculoskeletal: No joint pain Neuro: No headache, no dizziness, no motor weakness.  Skin: No rash Endocrine:  No polydipsia. No polyuria.  Psych: Denies SI/HI  No problems updated.  ALLERGIES: Allergies  Allergen Reactions  . Avocado Itching    PAST MEDICAL HISTORY: Past Medical History:  Diagnosis Date  . Anxiety associated with depression   . Hyperpigmentation    of tongue  . Seasonal allergies     MEDICATIONS AT HOME: Prior to Admission medications   Medication Sig Start Date End Date Taking? Authorizing Provider  cephALEXin (KEFLEX) 500 MG capsule Take 1 capsule (500 mg total) by mouth 3 (three) times daily. 08/08/17   Azalia Bilisampos, Kevin, MD  EPINEPHrine (EPIPEN 2-PAK) 0.3 mg/0.3 mL IJ SOAJ injection Use as directed for severe allergic reaction Patient not taking: Reported on 07/17/2017 10/06/16   Bobbitt, Heywood Ilesalph Carter, MD  fexofenadine [facility](ALLEGRA ALLERGY) 180 MG tablet Take 1 tablet (180 mg total) by mouth daily. Patient not taking: Reported on 07/17/2017 12/26/16   Lizbeth BarkHairston, Mandesia  R, FNP  fluticasone (FLONASE) 50 MCG/ACT nasal spray Place 2 sprays into both nostrils daily. Patient not taking: Reported on 07/17/2017 08/06/16   Lizbeth BarkHairston, Mandesia R, FNP  levocetirizine (XYZAL) 5 MG tablet Take 1 tablet (5 mg total) by mouth every evening. Patient not taking: Reported on 12/26/2016 10/06/16   Bobbitt, Heywood Ilesalph Carter, MD  loratadine (CLARITIN) 10 MG tablet Take 1 tablet (10 mg total) by mouth daily. 07/17/17   Marcine MatarJohnson, Deborah B, MD  Olopatadine HCl 0.2 % SOLN Apply 1 drop to eye daily. Patient not taking: Reported on 12/26/2016 10/06/16   Bobbitt, Heywood Ilesalph Carter, MD  ranitidine (ZANTAC 75) 75 MG tablet Take 1 tablet (75 mg total) by mouth 2 (two) times daily. Patient not taking: Reported on 09/05/2016 08/06/16   Lizbeth BarkHairston, Mandesia R, FNP  sulfamethoxazole-trimethoprim (BACTRIM) 400-80 MG tablet Take 1 tablet by mouth 2 (two) times daily. 07/17/17   Marcine MatarJohnson, Deborah B, MD     Objective:  EXAM:   Vitals:   10/01/17 1002  BP: 105/70  Pulse: 77  Resp: 18  Temp: 98.2 F (36.8 C)  TempSrc: Oral  SpO2: 98%  Weight: 160 lb (72.6 kg)  Height: 5\' 1"  (1.549 m)    General appearance : A&OX3. NAD. Non-toxic-appearing HEENT: Atraumatic and Normocephalic.  PERRLA. EOM intact.   Neck: supple, no JVD. No cervical lymphadenopathy. No thyromegaly Chest/Lungs:  Breathing-non-labored, Good air entry bilaterally, breath sounds normal without rales, rhonchi, or wheezing  CVS: S1 S2 regular, no murmurs, gallops, rubs  Abdomen: Bowel sounds present, Non tender and not distended with no gaurding, rigidity or rebound. Extremities: Bilateral Lower Ext shows no edema, both legs are warm to touch with = pulse throughout Neurology:  CN II-XII grossly intact, Non focal.   Psych:  TP linear. J/I WNL. Normal speech. Appropriate eye contact and affect.  Skin:  No Rash  Data Review Lab Results  Component Value Date   HGBA1C 5.6 09/05/2016     Assessment & Plan   1. Hyperglycemia I have had a  lengthy discussion and provided education about insulin resistance and the intake of too much sugar/refined carbohydrates.  I have advised the patient to work at a goal of eliminating sugary drinks, candy, desserts, sweets, refined sugars, processed foods, and white carbohydrates.  The patient expresses understanding.    2. Depot contraception Depot given today - POCT urine pregnancy - Urinalysis Dipstick  3. Generalized abdominal pain Non-acute abdomen; recent kidney and liver function WNL; no labs indicated today as pain has improved over the last couple of days, afebrile, no N/V/D.    Patient have been counseled extensively about nutrition and exercise  Return in about 2 months (around 12/02/2017) for assign new PCP and f/up hyperglycemia.  The patient was given clear instructions to go to ER or return to medical center if symptoms don't improve, worsen or new problems develop. The patient verbalized understanding. The patient was told to call to get lab results if they haven't heard anything in the next week.     Georgian Co, PA-C [facility] [place], [place] [phone]   10/01/2017, 10:04 AM

## 2017-12-14 ENCOUNTER — Encounter: Payer: Self-pay | Admitting: Family Medicine

## 2017-12-14 ENCOUNTER — Ambulatory Visit: Payer: Self-pay | Attending: Family Medicine | Admitting: Family Medicine

## 2017-12-14 VITALS — BP 114/77 | HR 104 | Temp 98.3°F | Resp 16 | Ht 61.0 in | Wt 162.8 lb

## 2017-12-14 DIAGNOSIS — G8929 Other chronic pain: Secondary | ICD-10-CM

## 2017-12-14 DIAGNOSIS — L819 Disorder of pigmentation, unspecified: Secondary | ICD-10-CM

## 2017-12-14 DIAGNOSIS — M25512 Pain in left shoulder: Secondary | ICD-10-CM

## 2017-12-14 DIAGNOSIS — J302 Other seasonal allergic rhinitis: Secondary | ICD-10-CM

## 2017-12-14 DIAGNOSIS — J3089 Other allergic rhinitis: Secondary | ICD-10-CM

## 2017-12-14 DIAGNOSIS — L989 Disorder of the skin and subcutaneous tissue, unspecified: Secondary | ICD-10-CM | POA: Insufficient documentation

## 2017-12-14 MED ORDER — LORATADINE 10 MG PO TABS: 10.0000 mg | ORAL_TABLET | Freq: Every day | ORAL | 5 refills | Status: DC

## 2017-12-14 MED ORDER — FLUTICASONE PROPIONATE 50 MCG/ACT NA SUSP: 2.0000 | Freq: Every day | NASAL | 6 refills | Status: DC

## 2017-12-14 MED ORDER — MELOXICAM 15 MG PO TABS: 15.0000 mg | ORAL_TABLET | Freq: Every day | ORAL | 0 refills | Status: DC

## 2017-12-14 NOTE — Patient Instructions (Signed)
Follow-up with dermatology for further  of what I suspect is vitiligo (skin discoloration.) I recommend wearing sun screen as often as possible when in direct sunlight.    Dolor en el hombro Shoulder Pain Muchas cosas pueden provocar dolor en el hombro, por ejemplo:  Una lesin en la zona.  El uso excesivo del hombro.  Artritis.  La causa del dolor puede ser lo siguiente:  Inflamacin.  Una lesin en la articulacin del hombro.  Una lesin en un tendn, ligamento o hueso.  Siga estas instrucciones en su casa: Tome estas medidas para Acupuncturist dolor:  Apriete una pelota blanda o una almohadilla de goma tanto como sea posible. Esto ayuda e prevenir la hinchazn en el hombro. Tambin ayuda a Medical sales representative.  Tome los medicamentos de venta libre y los recetados solamente como se lo haya indicado el mdico.  Si se lo indican, aplique hielo sobre la zona: ? Nature conservation officer hielo en una bolsa plstica. ? Coloque una [employer] piel y la bolsa de hielo. ? Coloque el hielo durante , 2 a 3veces por da. Deje de aplicar hielo si no ayuda a Engineer, materials.  Si le indicaron que use un cabestrillo o un inmovilizador en el hombro: ? selos como se lo hayan indicado. ? Qutesela para ducharse o para baarse. ? Mueva el brazo lo menos posible, pero mantenga la mano en movimiento para evitar la hinchazn.  Comunquese con un mdico si:  El Product/process development scientist.  El dolor no se alivia con los [employer].  Aparece un dolor nuevo en el brazo, la mano o los dedos. Solicite ayuda de inmediato si:  El brazo, la mano o los dedos: ? Hormiguean. ? Se adormecen. ? Se hinchan. ? Duelen. ? Se tornan de color blanco o azul. Esta informacin no tiene Theme park manager el consejo del mdico. Asegrese de hacerle al mdico cualquier pregunta que tenga. Document Released: 12/11/2004 Document Revised: 07/15/2016 Document Reviewed: 06/26/2014 Elsevier Interactive Patient Education   [employer].
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
## 2017-12-31 ENCOUNTER — Ambulatory Visit: Payer: Self-pay | Attending: Family Medicine | Admitting: Physician Assistant

## 2017-12-31 VITALS — BP 110/69 | HR 83 | Temp 99.4°F | Resp 18 | Ht 61.0 in | Wt 163.0 lb

## 2017-12-31 DIAGNOSIS — Z91018 Allergy to other foods: Secondary | ICD-10-CM | POA: Insufficient documentation

## 2017-12-31 DIAGNOSIS — R519 Headache, unspecified: Secondary | ICD-10-CM

## 2017-12-31 DIAGNOSIS — H539 Unspecified visual disturbance: Secondary | ICD-10-CM

## 2017-12-31 DIAGNOSIS — Z79899 Other long term (current) drug therapy: Secondary | ICD-10-CM | POA: Insufficient documentation

## 2017-12-31 DIAGNOSIS — Z13220 Encounter for screening for lipoid disorders: Secondary | ICD-10-CM

## 2017-12-31 DIAGNOSIS — R202 Paresthesia of skin: Secondary | ICD-10-CM | POA: Insufficient documentation

## 2017-12-31 DIAGNOSIS — R51 Headache: Secondary | ICD-10-CM | POA: Insufficient documentation

## 2017-12-31 MED ORDER — NAPROXEN 500 MG PO TABS: 500.0000 mg | ORAL_TABLET | Freq: Two times a day (BID) | ORAL | 0 refills | Status: DC

## 2017-12-31 NOTE — Progress Notes (Signed)
Patient ID: Alison Farmer, female   DOB: 1970/08/25, 47 y.o.   MRN: [id number]     Alison Farmer, is a 47 y.o. female  [id number]:[id number]  [id number]:[id number]  DOB - 11-27-1970  Subjective:  Chief Complaint and HPI: Alison Farmer is a 47 y.o. female here today for an issue with last week the R side of face felt like it had fallen asleep didn't pay it much attention.  She thought maybe she used bad make-up on her face.  Lasted 1 hour. no rash.  Hasn't occurred again since then.  She doesn't remember which day it happened.  This occurred 1 day last week.  Then Friday had a HA all night long on the R side of her head.  There was no associated weakness or aphasia.  No vomiting.  HA was gone the next morning. Then she has had various arm and leg pains and paresthesias over the last week or so.  No f/c.  No cough/URI s/sx.    Also needs to see eye doctor to get glasses.  [place] [facility] [place] [facility] [place] and never was able to use the glasses they prescribed her.  This has been going on a long time and not related to the above episodes.    Family history:  No FH early stroke/MI except in husband  ROS:   Constitutional:  No f/c, No night sweats, No unexplained weight loss. EENT:  No vision changes, No blurry vision, No hearing changes. No mouth, throat, or ear problems.  Respiratory: No cough, No SOB Cardiac: No CP, no palpitations GI:  No abd pain, No N/V/D. GU: No Urinary s/sx Musculoskeletal: No joint pain Neuro: + headache, no dizziness, no motor weakness.  Skin: No rash Endocrine:  No polydipsia. No polyuria.  Psych: Denies SI/HI  No problems updated.  ALLERGIES: Allergies  Allergen Reactions  . Avocado Itching    PAST MEDICAL HISTORY: Past Medical History:  Diagnosis Date  . Anxiety associated with depression   . Hyperpigmentation    of tongue  . Seasonal allergies     MEDICATIONS AT HOME: Prior to Admission medications   Medication Sig Start  Date End Date Taking? Authorizing Provider  fluticasone (FLONASE) 50 MCG/ACT nasal spray Place 2 sprays into both nostrils daily. 12/14/17   Bing Neighbors, FNP  loratadine (CLARITIN) 10 MG tablet Take 1 tablet (10 mg total) by mouth daily. 12/14/17   Bing Neighbors, FNP  naproxen (NAPROSYN) 500 MG tablet Take 1 tablet (500 mg total) by mouth 2 (two) times daily with a meal. X 5 days then prn pain 12/31/17   Anders Simmonds, PA-C     Objective:  EXAM:   Vitals:   12/31/17 1544  BP: 110/69  Pulse: 83  Resp: 18  Temp: 99.4 F (37.4 C)  TempSrc: Oral  SpO2: 98%  Weight: 163 lb (73.9 kg)  Height: 5\' 1"  (1.549 m)    General appearance : A&OX3. NAD. Non-toxic-appearing HEENT: Atraumatic and Normocephalic.  PERRLA. EOM intact.  TM clear B. Mouth-MMM, post pharynx WNL w/o erythema, No PND. Neck: supple, no JVD. No cervical lymphadenopathy. No thyromegaly Chest/Lungs:  Breathing-non-labored, Good air entry bilaterally, breath sounds normal without rales, rhonchi, or wheezing  CVS: S1 S2 regular, no murmurs, gallops, rubs  Extremities: Bilateral Lower Ext shows no edema, both legs are warm to touch with = pulse throughout.  DTR U/L ext=B Neurology:  CN II-XII grossly intact, Non focal.   Psych:  TP linear. J/I WNL. Normal speech. Appropriate eye contact and affect.  Skin:  No Rash  Data Review Lab Results  Component Value Date   HGBA1C 5.6 09/05/2016     Assessment & Plan   1. Nonintractable episodic headache, unspecified headache type Resolved-no red flags - Comprehensive metabolic panel; Future - CBC with Differential/Platelet; Future - naproxen (NAPROSYN) 500 MG tablet; Take 1 tablet (500 mg total) by mouth 2 (two) times daily with a meal. X 5 days then prn pain  Dispense: 60 tablet; Refill: 0  2. Paresthesia N-V intact - TSH; Future - Vitamin D, 25-hydroxy; Future - naproxen (NAPROSYN) 500 MG tablet; Take 1 tablet (500 mg total) by mouth 2 (two) times daily with  a meal. X 5 days then prn pain  Dispense: 60 tablet; Refill: 0  3. Screening cholesterol level - Lipid Panel; Future  4. Vision changes - Ambulatory referral to Ophthalmology     Patient have been counseled extensively about nutrition and exercise  Return in about 1 month (around 01/31/2018) for assign PCP; f/up paresthesias.  The patient was given clear instructions to go to ER or return to medical center if symptoms don't improve, worsen or new problems develop. The patient verbalized understanding. The patient was told to call to get lab results if they haven't heard anything in the next week.     Georgian Co, PA-C [facility] [place], [place] [phone]   12/31/2017, 4:16 PM

## 2018-01-01 ENCOUNTER — Ambulatory Visit: Payer: Self-pay | Attending: Family Medicine

## 2018-01-01 DIAGNOSIS — R202 Paresthesia of skin: Secondary | ICD-10-CM | POA: Insufficient documentation

## 2018-01-01 DIAGNOSIS — R519 Headache, unspecified: Secondary | ICD-10-CM

## 2018-01-01 DIAGNOSIS — Z13220 Encounter for screening for lipoid disorders: Secondary | ICD-10-CM | POA: Insufficient documentation

## 2018-01-01 DIAGNOSIS — R51 Headache: Secondary | ICD-10-CM | POA: Insufficient documentation

## 2018-01-01 NOTE — Progress Notes (Signed)
Patient here for lab visit  

## 2018-01-02 LAB — COMPREHENSIVE METABOLIC PANEL
ALBUMIN: 4.3 g/dL (ref 3.5–5.5)
ALT: 14 IU/L (ref 0–32)
AST: 17 IU/L (ref 0–40)
Albumin/Globulin Ratio: 1.4 (ref 1.2–2.2)
Alkaline Phosphatase: 77 IU/L (ref 39–117)
BUN / CREAT RATIO: 12 (ref 9–23)
BUN: 8 mg/dL (ref 6–24)
Bilirubin Total: 0.4 mg/dL (ref 0.0–1.2)
CALCIUM: 9.3 mg/dL (ref 8.7–10.2)
CO2: 22 mmol/L (ref 20–29)
CREATININE: 0.67 mg/dL (ref 0.57–1.00)
Chloride: 106 mmol/L (ref 96–106)
GFR calc Af Amer: 122 mL/min/{1.73_m2} (ref >59)
GFR, EST NON AFRICAN AMERICAN: 106 mL/min/{1.73_m2} (ref >59)
GLUCOSE: 85 mg/dL (ref 65–99)
Globulin, Total: 3 g/dL (ref 1.5–4.5)
Potassium: 4.2 mmol/L (ref 3.5–5.2)
Sodium: 142 mmol/L (ref 134–144)
Total Protein: 7.3 g/dL (ref 6.0–8.5)

## 2018-01-02 LAB — CBC WITH DIFFERENTIAL/PLATELET
BASOS ABS: 0 10*3/uL (ref 0.0–0.2)
Basos: 1 %
EOS (ABSOLUTE): 0.2 10*3/uL (ref 0.0–0.4)
Eos: 3 %
HEMOGLOBIN: 12.3 g/dL (ref 11.1–15.9)
Hematocrit: 36.9 % (ref 34.0–46.6)
IMMATURE GRANULOCYTES: 0 %
Immature Grans (Abs): 0 10*3/uL (ref 0.0–0.1)
LYMPHS: 35 %
Lymphocytes Absolute: 2.1 10*3/uL (ref 0.7–3.1)
MCH: 26.3 pg — ABNORMAL LOW (ref 26.6–33.0)
MCHC: 33.3 g/dL (ref 31.5–35.7)
MCV: 79 fL (ref 79–97)
MONOCYTES: 6 %
Monocytes Absolute: 0.3 10*3/uL (ref 0.1–0.9)
NEUTROS PCT: 55 %
Neutrophils Absolute: 3.4 10*3/uL (ref 1.4–7.0)
Platelets: 190 10*3/uL (ref 150–450)
RBC: 4.68 x10E6/uL (ref 3.77–5.28)
RDW: 14.3 % (ref 12.3–15.4)
WBC: 6 10*3/uL (ref 3.4–10.8)

## 2018-01-02 LAB — LIPID PANEL
CHOL/HDL RATIO: 3.3 ratio (ref 0.0–4.4)
CHOLESTEROL TOTAL: 154 mg/dL (ref 100–199)
HDL: 46 mg/dL (ref >39)
LDL CALC: 96 mg/dL (ref 0–99)
Triglycerides: 58 mg/dL (ref 0–149)
VLDL Cholesterol Cal: 12 mg/dL (ref 5–40)

## 2018-01-02 LAB — TSH: TSH: 1.74 u[IU]/mL (ref 0.450–4.500)

## 2018-01-02 LAB — VITAMIN D 25 HYDROXY (VIT D DEFICIENCY, FRACTURES): VIT D 25 HYDROXY: 30.8 ng/mL (ref 30.0–100.0)

## 2018-01-05 ENCOUNTER — Telehealth: Payer: Self-pay | Admitting: *Deleted

## 2018-01-05 NOTE — Telephone Encounter (Signed)
Medical Assistant used [employer] to contact patient.  Interpreter Name: Bonnee Quin Interpreter #: [phone] Patient verified DOB Patient is aware of all labs being normal including kidney, liver and thyroid. Patient is aware of needing to continue vitamin d 2000 units OTC to keep these levels in healthy range and to follow up as planned. Patient wrote down the results.

## 2018-01-05 NOTE — Telephone Encounter (Signed)
-----   Message from Anders Simmonds, [place] sent at 01/05/2018  9:26 AM EDT ----- Please call patient.  Her labs are normal including, vitamin D, blood sugar, kidney function, liver function, electrolytes, blood count, thyroid, and cholesterol.  She needs to take vitamin D over the counter at 2000units daily f=to keep these levels healthy.  Follow-up as planned.  Thanks, Georgian Co, PA-C

## 2018-01-18 ENCOUNTER — Ambulatory Visit: Payer: Self-pay

## 2018-01-19 ENCOUNTER — Ambulatory Visit: Payer: Self-pay | Attending: Family Medicine

## 2018-02-04 ENCOUNTER — Ambulatory Visit: Payer: Self-pay | Attending: Family Medicine | Admitting: Family Medicine

## 2018-02-04 ENCOUNTER — Encounter: Payer: Self-pay | Admitting: Family Medicine

## 2018-02-04 VITALS — BP 123/81 | HR 77 | Temp 98.5°F | Ht 61.0 in | Wt 165.0 lb

## 2018-02-04 DIAGNOSIS — M25512 Pain in left shoulder: Secondary | ICD-10-CM

## 2018-02-04 DIAGNOSIS — Z2821 Immunization not carried out because of patient refusal: Secondary | ICD-10-CM | POA: Insufficient documentation

## 2018-02-04 DIAGNOSIS — M25511 Pain in right shoulder: Secondary | ICD-10-CM

## 2018-02-04 DIAGNOSIS — G5601 Carpal tunnel syndrome, right upper limb: Secondary | ICD-10-CM

## 2018-02-04 DIAGNOSIS — Z7689 Persons encountering health services in other specified circumstances: Secondary | ICD-10-CM | POA: Insufficient documentation

## 2018-02-04 DIAGNOSIS — G8929 Other chronic pain: Secondary | ICD-10-CM | POA: Insufficient documentation

## 2018-02-04 DIAGNOSIS — H539 Unspecified visual disturbance: Secondary | ICD-10-CM

## 2018-02-04 DIAGNOSIS — Z79899 Other long term (current) drug therapy: Secondary | ICD-10-CM | POA: Insufficient documentation

## 2018-02-04 DIAGNOSIS — R21 Rash and other nonspecific skin eruption: Secondary | ICD-10-CM

## 2018-02-04 DIAGNOSIS — M67919 Unspecified disorder of synovium and tendon, unspecified shoulder: Secondary | ICD-10-CM

## 2018-02-04 MED ORDER — KETOCONAZOLE 2 % EX CREA: 1.0000 "application " | TOPICAL_CREAM | Freq: Every day | CUTANEOUS | 4 refills | Status: DC

## 2018-02-04 NOTE — Patient Instructions (Signed)
Sndrome del tnel carpiano (Carpal Tunnel Syndrome) El sndrome del tnel carpiano es una afeccin que causa dolor en la mano y en el brazo. El tnel carpiano es un espacio estrecho ubicado en el lado palmar de la mueca. Los movimientos repetidos de la mueca o determinadas enfermedades pueden causar la hinchazn del tnel. Esta hinchazn puede comprimir el nervio principal de la mueca (nervio mediano). CUIDADOS EN EL HOGAR Si tiene una frula:  sela como se lo haya indicado el mdico. Qutesela solamente como se lo haya indicado el mdico.  Afloje la frula si los dedos: ? Se le adormecen y siente hormigueos. ? Se le ponen azulados y fros.  Mantenga la frula limpia y seca. Instrucciones generales  Tome los medicamentos de venta libre y los recetados solamente como se lo haya indicado el mdico.  Haga reposar la mueca de toda actividad que le cause dolor. Si es necesario, hable con su empleador sobre los cambios que pueden hacerse en su lugar de trabajo, por ejemplo, usar una almohadilla para apoyar la mueca mientras tipea.  Si se lo indican, aplique hielo sobre la zona dolorida: ? Ponga el hielo en una bolsa plstica. ? Coloque una toalla entre la piel y la bolsa de hielo. ? Coloque el hielo durante 20minutos, 2 a 3veces por da.  Concurra a todas las visitas de control como se lo haya indicado el mdico. Esto es importante.  Haga los ejercicios como se lo hayan indicado el mdico, el fisioterapeuta o el terapeuta ocupacional. SOLICITE AYUDA SI:  Aparecen nuevos sntomas.  Los medicamentos no le alivian el dolor.  Los sntomas empeoran. Esta informacin no tiene como fin reemplazar el consejo del mdico. Asegrese de hacerle al mdico cualquier pregunta que tenga. Document Released: 02/20/2011 Document Revised: 11/22/2014 Document Reviewed: 07/19/2014 Elsevier Interactive Patient Education  2018 Elsevier Inc.  

## 2018-02-04 NOTE — Progress Notes (Signed)
Subjective:    Patient ID: Alison Farmer, female    DOB: 08-16-1970, 47 y.o.   MRN: [id number]  HPI       47 year old female who was seen to establish care.  Patient was recently seen here by another provider for headache.  Patient states that the headache has gone away.  Patient had also had some sensation of numbness on the right side of the face which is also resolved.  Patient continues to have issues with difficulty seeing small print/visual issues.  Patient states that she has not heard from anyone regarding an optometry/ophthalmology referral.      Patient states that most of her shoulder pain has resolved and patient believes that the pain that she has in her shoulder, mostly on the right, improved after she was given medicine at her recent visit.  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  Patient also reports that recently she was using a vacuum and she felt that the vacuum kept slipping out of her hand because of hand numbness.  Upon questioning, patient does have numbness and tingling in the right hand that often wakes her up and patient feels as if she has to shake her hand to improve the numbness/tingling sensation in her hand.      Patient with complaint of a non-itchy rash on her bilateral forearms.  Patient states that she has been told in the past that this may be due to sun exposure.  Patient however would like to see a dermatologist.  Patient does not smoke or drink.  Patient reports that she does have a strong family history of heart disease with her mother dying from a heart attack at the age of 50.  Patient reports that she has had lipid panel done recently which was normal.  Patient has had no issues with chest pain.  Patient states that she did receive her lab results from her most recent visit but has not yet started vitamin D supplement as recommended. Past Medical History:  Diagnosis Date  . Anxiety  associated with depression   . Hyperpigmentation    of tongue  . Seasonal allergies    Past Surgical History:  Procedure Laterality Date  . CESAREAN SECTION    . HYSTEROSCOPY  06/15/09   IUD removal   Family History  Problem Relation Age of Onset  . Heart disease Mother 57       often fainted, had palpitations  . Heart disease Father   . Heart disease Sister        younger sister with palpitations/heart skipping beats requiring hospitalizations and meds  . Allergic rhinitis Neg Hx   . Angioedema Neg Hx   . Eczema Neg Hx   . Asthma Neg Hx    Social History   Tobacco Use  . Smoking status: Never Smoker  . Smokeless tobacco: Never Used  Substance Use Topics  . Alcohol use: No  . Drug use: No   Allergies  Allergen Reactions  . Avocado Itching   Current Outpatient Medications on File Prior to Visit  Medication Sig Dispense Refill  . naproxen (NAPROSYN) 500 MG tablet Take 1 tablet (500 mg total) by mouth 2 (two) times daily with a meal. X 5 days then prn pain 60 tablet 0  . fluticasone (FLONASE) 50 MCG/ACT nasal spray Place 2 sprays into both nostrils daily. (Patient not taking: Reported on 02/04/2018) 16 g 6  .  loratadine (CLARITIN) 10 MG tablet Take 1 tablet (10 mg total) by mouth daily. (Patient not taking: Reported on 02/04/2018) 30 tablet 5   No current facility-administered medications on file prior to visit.       Review of Systems  Constitutional: Positive for fatigue. Negative for chills and fever.  HENT: Negative for sore throat and trouble swallowing.   Eyes: Positive for visual disturbance. Negative for photophobia, pain and discharge.  Respiratory: Negative for cough and shortness of breath.   Cardiovascular: Negative for chest pain, palpitations and leg swelling.  Gastrointestinal: Negative for abdominal pain and nausea.  Endocrine: Negative for polydipsia, polyphagia and polyuria.  Genitourinary: Negative for dysuria and frequency.  Musculoskeletal:  Positive for arthralgias. Negative for back pain and gait problem.  Neurological: Positive for numbness and headaches (Occur occasionally). Negative for dizziness.  Hematological: Does not bruise/bleed easily.       Objective:   Physical Exam BP 123/81 (BP Location: Right Arm, Patient Position: Sitting, Cuff Size: Normal)   Pulse 77   Temp 98.5 F (36.9 C) (Oral)   Ht 5\' 1"  (1.549 m)   Wt 165 lb (74.8 kg)   SpO2 98%   BMI 31.18 kg/m  Nurse's notes and vital signs reviewed General-well-nourished, well-developed overweight female in no acute distress EENT- conjunctiva normal, extraocular movements are intact and pupils are equally round and reactive to light.  TMs gray, nares with mild edema of the nasal turbinates, normal oropharynx Neck-supple, no lymphadenopathy, no thyromegaly, no carotid bruit Lungs-clear to auscultation bilaterally Cardiovascular-regular rate and rhythm Abdomen-soft, nontender Back-no CVA tenderness Extremities-no edema Skin- patient has some hyperpigmented skin on the forearms bilaterally with 1-2 scattered small hypopigmented dots.  Patient has some moles on the back but none of these appear to be irregular in shape and are not of an abnormal size Musculoskeletal- patient with some pain with palpation of the anterior and posterior lateral right shoulder and positive empty can sign bilaterally but patient states that at today's visit her left shoulder actually hurts a little more than the right with empty can maneuver and resistance.  Patient with positive Tinel at the right wrist        Assessment & Plan:  1. Acute pain of both shoulders Patient with complaint of acute on chronic pain in both shoulders.  Patient with positive impingement sign/empty can sign and I discussed with the patient that she likely has some rotator cuff tendinopathy/tendinitis - Ambulatory referral to Orthopedic Surgery  2. Tendinopathy of rotator cuff, unspecified laterality Patient  with findings on exam consistent with rotator cuff issues such as tendinitis or tendinopathy.  Patient is being referred to orthopedics for further evaluation and treatment. - Ambulatory referral to Orthopedic Surgery  3. 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  Patient is being referred to orthopedics.  Patient given handout in Spanish as part of her AVS regarding carpal tunnel syndrome - Ambulatory referral to Orthopedic Surgery  4. Visual disturbance Patient was given a list of eye specialist that she can contact to follow-up regarding visual disturbance.  Patient states that she was given glasses at one point and it sounds as if these were bifocals as patient states that she had difficulty looking through the bottom part of the glasses and felt as if she could not walk properly when wearing the glasses.  5. Rash and nonspecific skin eruption Patient with complaint of rash.  Some of patient's rash is likely secondary to chronic sun exposure.  Patient will also be given Nizoral to see if some of her rash may be related to tinea versicolor.  Patient also have her is being referred to dermatology for further evaluation as well as general skin check. - ketoconazole (NIZORAL) 2 % cream; Apply 1 application topically daily. To affected skin areas x10 days then as needed  Dispense: 15 g; Refill: 4 - Ambulatory referral to Dermatology  *Influenza immunization offered but declined at today's visit  An After Visit Summary was printed and given to the patient.  Return for 6 months and as needed.

## 2018-05-04 ENCOUNTER — Encounter: Payer: Self-pay | Admitting: Family Medicine

## 2018-06-03 ENCOUNTER — Other Ambulatory Visit: Payer: Self-pay | Admitting: Family Medicine

## 2018-06-03 ENCOUNTER — Telehealth: Payer: Self-pay | Admitting: Family Medicine

## 2018-06-03 ENCOUNTER — Ambulatory Visit: Payer: Self-pay

## 2018-06-03 DIAGNOSIS — M25512 Pain in left shoulder: Secondary | ICD-10-CM

## 2018-06-03 DIAGNOSIS — G8929 Other chronic pain: Secondary | ICD-10-CM

## 2018-06-03 DIAGNOSIS — M67919 Unspecified disorder of synovium and tendon, unspecified shoulder: Secondary | ICD-10-CM

## 2018-06-03 DIAGNOSIS — M25511 Pain in right shoulder: Secondary | ICD-10-CM

## 2018-06-03 NOTE — Telephone Encounter (Signed)
Patient called because she would now like to see the orthopedic. Patient states the medication she was prescribed no longer works for her. Please follow up.

## 2018-06-03 NOTE — Telephone Encounter (Signed)
Please notify patient that referral was placed for Orthopedics

## 2018-06-03 NOTE — Telephone Encounter (Signed)
Please advise patient on this referral request

## 2018-06-03 NOTE — Progress Notes (Signed)
Patient ID: Alison Farmer, female   DOB: 04/09/1970, 48 y.o.   MRN: [id number]   Patient left a message that she would now like a referral to Orthopedics as she has had continued bilateral shoulder pain and the medication that she was recently prescribed is no longer working. Referral placed

## 2018-06-04 NOTE — Telephone Encounter (Signed)
Attempted to contact patient no answer °

## 2018-06-04 NOTE — Telephone Encounter (Signed)
Notify patient that the referral has been placed and she will receive a phone call from the orthopedic to schedule her appointment.

## 2018-06-30 ENCOUNTER — Ambulatory Visit (INDEPENDENT_AMBULATORY_CARE_PROVIDER_SITE_OTHER): Payer: Self-pay | Admitting: Orthopedic Surgery

## 2018-07-05 ENCOUNTER — Ambulatory Visit: Payer: Self-pay | Attending: Family Medicine | Admitting: Family Medicine

## 2018-07-05 ENCOUNTER — Encounter: Payer: Self-pay | Admitting: Family Medicine

## 2018-07-05 ENCOUNTER — Other Ambulatory Visit: Payer: Self-pay

## 2018-07-05 DIAGNOSIS — R202 Paresthesia of skin: Secondary | ICD-10-CM

## 2018-07-05 DIAGNOSIS — R200 Anesthesia of skin: Secondary | ICD-10-CM

## 2018-07-05 DIAGNOSIS — M5416 Radiculopathy, lumbar region: Secondary | ICD-10-CM

## 2018-07-05 MED ORDER — PREDNISONE 20 MG PO TABS: ORAL_TABLET | ORAL | 0 refills | Status: DC

## 2018-07-05 NOTE — Progress Notes (Signed)
Virtual Visit via Telephone Note  I connected with Alison Farmer on 07/05/18 at  1:50 PM EDT by telephone and verified that I am speaking with the correct person using two identifiers.  Patient was offered interpreter service when she spoke with the CMA but patient preferred to have her adult son, Durwin NoraRudy Martinez act as the interpreter.   I discussed the limitations, risks, security and privacy concerns of performing an evaluation and management service by telephone and the availability of in person appointments. I also discussed with the patient that there may be a patient responsible charge related to this service. The patient expressed understanding and agreed to proceed.  Patient location: Home Provider location: Office Call was initiated by Guillermina [place]ct[place] Richard, CMA and patient's son, Durwin NoraRudy Martinez acted as the interpreter for today's tele-health visit.  History of Present Illness:       48 yo female who recently called to request a referral to Orthopedics due to pain in her arms/shoulders. She did not keep the appointment with Orthtopedics as she decided to try and keep working and see if her arms felt better. She has however started getting painful numbness and tingling in her arms when she is asleep which wakes her up. She has also started having pain which radiates from her right buttock down to her calf and she feels that her toes have fallen asleep at times. Toes occasionally become numb and tingle. She reports no recent injury to her back, legs, shoulder or neck. No loss of or change in bowel and bladder function.  The pain that radiates down her right leg is burning in nature and ranges from a 6-8 on a 0-10 scale. Activity improves the pain but pain is worse when she has been sitting and then tries to stand-up or when she is sitting in a car and then tries to get out of the car. Pain in her right buttock and leg have been present for about 2 weeks.   Past Medical History:  Diagnosis Date   . Anxiety associated with depression   . Hyperpigmentation    of tongue  . Seasonal allergies    Past Surgical History:  Procedure Laterality Date  . CESAREAN SECTION    . HYSTEROSCOPY  06/15/09   IUD removal   Family History  Problem Relation Age of Onset  . Heart disease Mother [id number]       often fainted, had palpitations  . Heart disease Father   . Heart disease Sister        younger sister with palpitations/heart skipping beats requiring hospitalizations and meds  . Allergic rhinitis Neg Hx   . Angioedema Neg Hx   . Eczema Neg Hx   . Asthma Neg Hx    Social History   Tobacco Use  . Smoking status: Never Smoker  . Smokeless tobacco: Never Used  Substance Use Topics  . Alcohol use: No  . Drug use: No   Allergies  Allergen Reactions  . Avocado Itching   Review of Systems  Constitutional: Positive for malaise/fatigue. Negative for chills and fever.  HENT: Negative for congestion and sore throat.   Respiratory: Negative for cough and shortness of breath.   Cardiovascular: Negative for chest pain, palpitations and leg swelling.  Gastrointestinal: Negative for abdominal pain, nausea and vomiting.  Genitourinary: Negative for dysuria and frequency.  Musculoskeletal: Positive for back pain and myalgias. Negative for falls.  Neurological: Negative for dizziness and headaches.       Numbness in arms  and right toes      Observations/Objective: No vital signs or physical exam as visit was conducted by phone  Assessment and Plan: 1. Acute right lumbar radiculopathy Discussed with patient and son that I believe that patient likely has lumbar radiculopathy.  She can continue the use of naproxen for pain and prescription will be sent to her pharmacy for prednisone taper.  Patient will be referred to orthopedics in follow-up of her low back pain with radiation as well as bilateral arm numbness and tingling while sleep.  2. Bilateral arm numbness and tingling while  sleeping Patient may have a cervical radiculopathy, and/or carpal tunnel syndrome and shoulder tendinopathy.  Patient will be referred to orthopedics for further evaluation and treatment.  Patient has been placed on prednisone for lumbar radiculopathy which may also help with her arm pain and numbness. - AMB referral to orthopedics  Follow Up Instructions:Return in about 2 weeks (around 07/19/2018) for back pain: in-office visit in 2 weeks if not improved.    I discussed the assessment and treatment plan with the patient. The patient was provided an opportunity to ask questions and all were answered. The patient agreed with the plan and demonstrated an understanding of the instructions.   The patient was advised to call back or seek an in-person evaluation if the symptoms worsen or if the condition fails to improve as anticipated.  I provided 12 minutes of non-face-to-face time during this encounter.   Cain Saupe, MD

## 2018-07-05 NOTE — Progress Notes (Signed)
Per pt she is having right leg pain for the past 2 months intermittent, per pt it hurts when she walks. Per patient her legs do not appear to look swollen.   Per pt she is also having arm pain in both arms  Per pt patient her legs feels numb and tender and sore.

## 2018-07-12 ENCOUNTER — Ambulatory Visit (INDEPENDENT_AMBULATORY_CARE_PROVIDER_SITE_OTHER): Payer: Self-pay | Admitting: Orthopedic Surgery

## 2018-07-19 ENCOUNTER — Encounter: Payer: Self-pay | Admitting: Orthopedic Surgery

## 2018-07-19 ENCOUNTER — Other Ambulatory Visit: Payer: Self-pay

## 2018-07-19 ENCOUNTER — Ambulatory Visit (INDEPENDENT_AMBULATORY_CARE_PROVIDER_SITE_OTHER): Payer: Self-pay | Admitting: Orthopedic Surgery

## 2018-07-19 ENCOUNTER — Ambulatory Visit (INDEPENDENT_AMBULATORY_CARE_PROVIDER_SITE_OTHER): Payer: Self-pay

## 2018-07-19 DIAGNOSIS — M5416 Radiculopathy, lumbar region: Secondary | ICD-10-CM

## 2018-07-19 MED ORDER — METHOCARBAMOL 500 MG PO TABS: ORAL_TABLET | ORAL | 0 refills | Status: DC

## 2018-07-19 MED ORDER — PREDNISONE 5 MG (21) PO TBPK: ORAL_TABLET | ORAL | 0 refills | Status: DC

## 2018-07-19 NOTE — Progress Notes (Signed)
Office Visit Note   Patient: Alison Farmer           Date of Birth: January 23, 1971           MRN: [id number] Visit Date: 07/19/2018 Requested by: Lizbeth BarkHairston, Mandesia R, FNP [address] [place], [place] [id number] PCP: Lizbeth BarkHairston, Mandesia R, FNP  Subjective: No chief complaint on file.   HPI: Patient presents with 7830-month history of low back pain.  Denies any history of injury.  States he has numbness in toes 3 4 and 5.  She reports some difficulty going up and down stairs.  She has pain on a daily basis.  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  She has been doing a home exercise program of exercises for sciatica that she found on the Internet.  She also has been taking anti-inflammatories.              ROS: All systems reviewed are negative as they relate to the chief complaint within the history of present illness.  Patient denies  fevers or chills.   Assessment & Plan: Visit Diagnoses:  1. Radiculopathy, lumbar region     Plan: Impression is right-sided radiculopathy likely coming from either foraminal stenosis or bulging disc affecting the L5 nerve root.  No weakness but nerve root tension signs are positive on the right.  Plan Medrol Dosepak and muscle relaxers with 4-week return.  Continue with home exercises.  Will decide then for or against MRI scanning and injections.  Follow-Up Instructions: Return in about 4 weeks (around 08/16/2018).   Orders:  Orders Placed This Encounter  Procedures  . XR Lumbar Spine 2-3 Views   No orders of the defined types were placed in this encounter.     Procedures: No procedures performed   Clinical Data: No additional findings.  Objective: Vital Signs: There were no vitals taken for this visit.  Physical Exam:   Constitutional: Patient appears well-developed HEENT:  Head: Normocephalic Eyes:EOM are normal Neck: Normal range of motion Cardiovascular: Normal rate Pulmonary/chest: Effort  normal Neurologic: Patient is alert Skin: Skin is warm Psychiatric: Patient has normal mood and affect    Ortho Exam: Ortho exam demonstrates positive nerve root tension signs on the right negative on the left.  She does have some paresthesias in the L5 distribution on the right negative on the left.  Pedal pulses palpable.  Patient has good ankle dorsiflexion plantarflexion quad hamstring strength.  No groin pain with internal X rotation of either leg.  No masses lymphadenopathy or skin changes noted in that right or left leg region.  Gait is normal.  Reflexes symmetric 1+ out of 4 bilateral patella and Achilles with negative clonus.  Specialty Comments:  No specialty comments available.  Imaging: Xr Lumbar Spine 2-3 Views  Result Date: 07/19/2018 AP lateral lumbar spine reviewed.  There is mild degenerative disc disease at L5-S1.  Visualized hips appear normal.  Normal lordosis is present.  No significant spinal listhesis or compression fractures present.    PMFS History: Patient Active Problem List   Diagnosis Date Noted  . Food allergy 10/06/2016  . Oral allergy syndrome, initial encounter 10/06/2016  . Allergic conjunctivitis 10/06/2016  . Spider varicose veins 05/21/2010  . Skin nodule 05/21/2010  . 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 BACK PAIN, LUMBAR 01/04/2007  . SYMPTOM, INCONTINENCE, MIXED, URGE/STRESS 01/04/2007  . ANXIETY 05/14/2006  . Allergic rhinitis 05/14/2006   Past Medical History:  Diagnosis Date  .  Anxiety associated with depression   . Hyperpigmentation    of tongue  . Seasonal allergies     Family History  Problem Relation Age of Onset  . Heart disease Mother 5       often fainted, had palpitations  . Heart disease Father   . Heart disease Sister        younger sister with palpitations/heart skipping beats requiring hospitalizations and meds  . Allergic rhinitis Neg Hx   . Angioedema Neg Hx   . Eczema Neg Hx   . Asthma Neg Hx     Past Surgical History:   Procedure Laterality Date  . CESAREAN SECTION    . HYSTEROSCOPY  06/15/09   IUD removal   Social History   Occupational History  . Not on file  Tobacco Use  . Smoking status: Never Smoker  . Smokeless tobacco: Never Used  Substance and Sexual Activity  . Alcohol use: No  . Drug use: No  . Sexual activity: Not on file

## 2018-07-19 NOTE — Addendum Note (Signed)
Addended byPrescott Parma on: 07/19/2018 01:38 PM   Modules accepted: Orders

## 2018-08-16 ENCOUNTER — Ambulatory Visit: Payer: Self-pay | Admitting: Orthopedic Surgery

## 2018-08-19 ENCOUNTER — Other Ambulatory Visit: Payer: Self-pay

## 2018-08-19 ENCOUNTER — Ambulatory Visit (INDEPENDENT_AMBULATORY_CARE_PROVIDER_SITE_OTHER): Payer: Self-pay | Admitting: Orthopedic Surgery

## 2018-08-19 DIAGNOSIS — M5416 Radiculopathy, lumbar region: Secondary | ICD-10-CM

## 2018-08-19 MED ORDER — MELOXICAM 15 MG PO TABS: ORAL_TABLET | ORAL | 0 refills | Status: DC

## 2018-08-19 MED ORDER — METHOCARBAMOL 500 MG PO TABS: ORAL_TABLET | ORAL | 0 refills | Status: DC

## 2018-08-24 ENCOUNTER — Encounter: Payer: Self-pay | Admitting: Orthopedic Surgery

## 2018-08-24 NOTE — Progress Notes (Signed)
Office Visit Note   Patient: Alison Farmer           Date of Birth: 1970-07-29           MRN: [id number] Visit Date: 08/19/2018 Requested by: Alfonse Spruce, [place], [place] [address] PCP: Alfonse Spruce, FNP  Subjective: Chief Complaint  Patient presents with  . Lower Back - Follow-up    HPI: Patient presents for evaluation of low back and right leg pain.  She describes numbness and tingling in the leg.  Is getting little bit worse.  Tylenol and aspirin not helping.  Usually worse when she goes sitting to standing.  She reports definitely leg more than back pain.  She has been taking Mobic as well.  She has been trying stretching but to no avail.  Symptoms have been now been ongoing for longer than 6 weeks.              ROS: All systems reviewed are negative as they relate to the chief complaint within the history of present illness.  Patient denies  fevers or chills.   Assessment & Plan: Visit Diagnoses:  1. Radiculopathy, lumbar region     Plan: Impression is right-sided radiculopathy and low back pain.  Failure conservative management.  Plan MRI scan L-spine to evaluate right-sided radiculopathy.  Follow-up with me after that study.  May need some injections.  Follow-Up Instructions: Return for after MRI.   Orders:  Orders Placed This Encounter  Procedures  . MR Lumbar Spine w/o contrast   Meds ordered this encounter  Medications  . meloxicam (MOBIC) 15 MG tablet    Sig: 1 po q d prn    Dispense:  30 tablet    Refill:  0  . methocarbamol (ROBAXIN) 500 MG tablet    Sig: 1 po bid prn    Dispense:  30 tablet    Refill:  0      Procedures: No procedures performed   Clinical Data: No additional findings.  Objective: Vital Signs: There were no vitals taken for this visit.  Physical Exam:   Constitutional: Patient appears well-developed HEENT:  Head: Normocephalic Eyes:EOM are normal Neck: Normal range of motion  Cardiovascular: Normal rate Pulmonary/chest: Effort normal Neurologic: Patient is alert Skin: Skin is warm Psychiatric: Patient has normal mood and affect    Ortho Exam: Ortho exam demonstrates good ankle dorsiflexion plantarflexion quad hamstring strength with symmetric reflexes negative Babinski.  Pedal pulses palpable.  No definite nerve root tension signs right or left hand side but she does have some pain with forward lateral bending.  Not much in the way of muscle spasm in the back region.  No definite sciatic notch tenderness right left-hand side.  Specialty Comments:  No specialty comments available.  Imaging: No results found.   PMFS History: Patient Active Problem List   Diagnosis Date Noted  . Food allergy 10/06/2016  . Oral allergy syndrome, initial encounter 10/06/2016  . Allergic conjunctivitis 10/06/2016  . Spider varicose veins 05/21/2010  . Skin nodule 05/21/2010  . 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 BACK PAIN, LUMBAR 01/04/2007  . SYMPTOM, INCONTINENCE, MIXED, URGE/STRESS 01/04/2007  . ANXIETY 05/14/2006  . Allergic rhinitis 05/14/2006   Past Medical History:  Diagnosis Date  . Anxiety associated with depression   . Hyperpigmentation    of tongue  . Seasonal allergies     Family History  Problem Relation Age of Onset  . Heart disease Mother 18  often fainted, had palpitations  . Heart disease Father   . Heart disease Sister        younger sister with palpitations/heart skipping beats requiring hospitalizations and meds  . Allergic rhinitis Neg Hx   . Angioedema Neg Hx   . Eczema Neg Hx   . Asthma Neg Hx     Past Surgical History:  Procedure Laterality Date  . CESAREAN SECTION    . HYSTEROSCOPY  06/15/09   IUD removal   Social History   Occupational History  . Not on file  Tobacco Use  . Smoking status: Never Smoker  . Smokeless tobacco: Never Used  Substance and Sexual Activity  . Alcohol use: No  . Drug use: No  . Sexual activity: Not on  file

## 2018-09-21 ENCOUNTER — Encounter: Payer: Self-pay | Admitting: Orthopedic Surgery

## 2018-10-08 ENCOUNTER — Ambulatory Visit: Payer: Self-pay

## 2018-10-28 ENCOUNTER — Ambulatory Visit: Payer: Self-pay | Attending: Family Medicine

## 2018-10-28 ENCOUNTER — Other Ambulatory Visit: Payer: Self-pay

## 2018-10-29 ENCOUNTER — Ambulatory Visit: Payer: Self-pay | Attending: Internal Medicine | Admitting: Internal Medicine

## 2018-10-29 ENCOUNTER — Encounter: Payer: Self-pay | Admitting: Internal Medicine

## 2018-10-29 VITALS — BP 130/95

## 2018-10-29 DIAGNOSIS — R030 Elevated blood-pressure reading, without diagnosis of hypertension: Secondary | ICD-10-CM

## 2018-10-29 NOTE — Progress Notes (Signed)
Virtual Visit via Telephone Note Due to current restrictions/limitations of in-office visits due to the COVID-19 pandemic, this scheduled clinical appointment was converted to a telehealth visit  I connected with Alison DawleyFlorencia Chavarria Farmer on 10/29/18 at 5:17 p.m by telephone and verified that I am speaking with the correct person using two identifiers. I am in my office.  The patient is at home.  Only the patient , myself and Gean Maidensatalia [phone]) from [employer] participated in this encounter.  I discussed the limitations, risks, security and privacy concerns of performing an evaluation and management service by telephone and the availability of in person appointments. I also discussed with the patient that there may be a patient responsible charge related to this service. The patient expressed understanding and agreed to proceed.   History of Present Illness: Pt request UC visit for concern about BP  Pt had eye red last Thursday.  Someone told her that her blood pressure may be elevated as the cause. Pt checked her BP several times since then. Readings have been 123-140/80s.  She has not had any problems with blood pressure in the past.  She tells me that blood pressure runs in her family. No CP, SOB, LE edema   Outpatient Encounter Medications as of 10/29/2018  Medication Sig  . fluticasone (FLONASE) 50 MCG/ACT nasal spray Place 2 sprays into both nostrils daily. (Patient not taking: Reported on 02/04/2018)  . ketoconazole (NIZORAL) 2 % cream Apply 1 application topically daily. To affected skin areas x10 days then as needed (Patient not taking: Reported on 10/29/2018)  . loratadine (CLARITIN) 10 MG tablet Take 1 tablet (10 mg total) by mouth daily. (Patient not taking: Reported on 02/04/2018)  . meloxicam (MOBIC) 15 MG tablet 1 po q d prn (Patient not taking: Reported on 10/29/2018)  . methocarbamol (ROBAXIN) 500 MG tablet 1 po tid prn (Patient not taking: Reported on 10/29/2018)  .  methocarbamol (ROBAXIN) 500 MG tablet 1 po bid prn (Patient not taking: Reported on 10/29/2018)  . naproxen (NAPROSYN) 500 MG tablet Take 1 tablet (500 mg total) by mouth 2 (two) times daily with a meal. X 5 days then prn pain (Patient not taking: Reported on 10/29/2018)  . predniSONE (DELTASONE) 20 MG tablet Take 2 pills on the first day then 1 pill daily for 2 days then 1/2 pill daily for 4 days; eat before taking medication (Patient not taking: Reported on 10/29/2018)  . predniSONE (STERAPRED UNI-PAK 21 TAB) 5 MG (21) TBPK tablet Take dosepak as directed (Patient not taking: Reported on 10/29/2018)   No facility-administered encounter medications on file as of 10/29/2018.     Observations/Objective: No direct observation done  Assessment and Plan: 1. Elevated blood pressure reading I went over with her what his normal blood pressure reading which is 120/80 or lower.  Advised her to continue to check blood pressure at least twice a week and bring in the readings in about 2 weeks to be reviewed by our clinical pharmacist.  I will have them call her with the appointment to see the clinical pharmacist. -DASH diet discussed and encouraged   Follow Up Instructions: With clinical pharmacist in 2 weeks   I discussed the assessment and treatment plan with the patient. The patient was provided an opportunity to ask questions and all were answered. The patient agreed with the plan and demonstrated an understanding of the instructions.   The patient was advised to call back or seek an in-person evaluation if the symptoms worsen or  if the condition fails to improve as anticipated.  I provided 9 minutes of non-face-to-face time during this encounter.   Karle Plumber, MD

## 2018-11-01 ENCOUNTER — Telehealth: Payer: Self-pay | Admitting: Family Medicine

## 2018-11-01 NOTE — Telephone Encounter (Signed)
Attempted to reach patient no answer lvm to call back °

## 2018-11-01 NOTE — Telephone Encounter (Signed)
-----   Message from Ladell Pier, MD sent at 10/29/2018  5:28 PM EDT ----- Regarding: This patient needs an appointment with clinical pharmacist in 2 weeks for repeat blood pressure check

## 2018-11-05 ENCOUNTER — Ambulatory Visit: Payer: Self-pay | Attending: Family Medicine | Admitting: Pharmacist

## 2018-11-05 ENCOUNTER — Other Ambulatory Visit: Payer: Self-pay

## 2018-11-05 ENCOUNTER — Encounter: Payer: Self-pay | Admitting: Pharmacist

## 2018-11-05 VITALS — BP 107/75

## 2018-11-05 DIAGNOSIS — R030 Elevated blood-pressure reading, without diagnosis of hypertension: Secondary | ICD-10-CM

## 2018-11-05 NOTE — Progress Notes (Signed)
   S:  PCP: Dr. Chapman Fitch    Patient arrives anxious. Presents to the clinic for hypertension evaluation, counseling, and management.  Patient was referred and last seen by Primary Care Provider on 10/29/18 via telemedicine - took BP at home and reported 130/95.  Patient does not take medications for BP.  Patient denies chest pain, dyspnea, HA or blurred vision. Denies BLE edema. Endorses anxiety and insomnia over the past several weeks relating to her children. Has tried chamomile tea without improvement of symptoms.   Current BP Medications include:  None   Antihypertensives tried in the past include: None  Dietary habits include: compliant with salt restriction; reports regular intake of caffeine  Exercise habits include: none outside of work since onset of the covid-19 pandemic Family / Social history:  - FHx: heart disease (mother, father, and sister) - Tobacco: never smoker - Alcohol: denies use  O:  L arm after 5 minutes rest: 107/75  Home BP readings:  - Brings log; uses wrist cuff and reports frequently getting error messages  - SBP range: 122-145 - DBP range: 75 - 90  Last 3 Office BP readings: BP Readings from Last 3 Encounters:  10/29/18 (!) 130/95  02/04/18 123/81  12/31/17 110/69   BMET    Component Value Date/Time   NA 142 01/01/2018 0925   K 4.2 01/01/2018 0925   CL 106 01/01/2018 0925   CO2 22 01/01/2018 0925   GLUCOSE 85 01/01/2018 0925   GLUCOSE 134 (H) 08/08/2017 0031   BUN 8 01/01/2018 0925   CREATININE 0.67 01/01/2018 0925   CREATININE 0.70 07/08/2013 1541   CALCIUM 9.3 01/01/2018 0925   GFRNONAA 106 01/01/2018 0925   GFRAA 122 01/01/2018 0925    Renal function: CrCl cannot be calculated (Patient's most recent lab result is older than the maximum 21 days allowed.).  Clinical ASCVD: No  The 10-year ASCVD risk score Mikey Bussing DC Jr., et al., 2013) is: 0.9%   Values used to calculate the score:     Age: 48 years     Sex: Female     Is Non-Hispanic  African American: No     Diabetic: No     Tobacco smoker: No     Systolic Blood Pressure: 914 mmHg     Is BP treated: No     HDL Cholesterol: 46 mg/dL     Total Cholesterol: 154 mg/dL   A/P: Hypertension  Undiagnosed. BP is normal. Encouraged patient to obtain new BP cuff for home use. We can recheck again in 1 month.  -No medications needed at this time.  -Counseled on lifestyle modifications for blood pressure control including reduced dietary sodium, increased exercise, adequate sleep  Results reviewed and written information provided. Total time in face-to-face counseling 15 minutes.   F/U Clinic Visit in 1 month.  Benard Halsted, PharmD, [place] [phone]

## 2018-11-24 ENCOUNTER — Other Ambulatory Visit: Payer: Self-pay

## 2018-11-24 ENCOUNTER — Encounter: Payer: Self-pay | Admitting: Family Medicine

## 2018-11-24 ENCOUNTER — Ambulatory Visit: Payer: Self-pay | Attending: Family Medicine | Admitting: Family Medicine

## 2018-11-24 DIAGNOSIS — G8929 Other chronic pain: Secondary | ICD-10-CM

## 2018-11-24 DIAGNOSIS — M79645 Pain in left finger(s): Secondary | ICD-10-CM
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
## 2018-11-24 NOTE — Progress Notes (Signed)
Medical Assistant used [employer] to contact patient.  Patient was not available, [employer] left patient a voicemail. Voicemail states to give a call back to [place] with [facility] at [phone]. Patient verified DOB Patient has not eaten today or taken any medication. Patient denies pain at this time. Patient states the back pain has subsided.  Patient is complaining of pain in her left hand from a finger she broke 2 years ago that is aching today.

## 2018-11-29 ENCOUNTER — Ambulatory Visit (INDEPENDENT_AMBULATORY_CARE_PROVIDER_SITE_OTHER): Payer: Self-pay | Admitting: Orthopedic Surgery

## 2018-11-29 ENCOUNTER — Ambulatory Visit: Payer: Self-pay

## 2018-11-29 ENCOUNTER — Encounter: Payer: Self-pay | Admitting: Orthopedic Surgery

## 2018-11-29 ENCOUNTER — Other Ambulatory Visit: Payer: Self-pay

## 2018-11-29 DIAGNOSIS — G8929 Other chronic pain: Secondary | ICD-10-CM

## 2018-11-29 DIAGNOSIS — M79645 Pain in left finger(s): Secondary | ICD-10-CM

## 2018-11-29 MED ORDER — DICLOFENAC SODIUM 50 MG PO TBEC: DELAYED_RELEASE_TABLET | ORAL | 0 refills | Status: DC

## 2018-12-03 ENCOUNTER — Encounter: Payer: Self-pay | Admitting: Orthopedic Surgery

## 2018-12-03 NOTE — Progress Notes (Signed)
Office Visit Note   Patient: Alison Farmer           Date of Birth: 12-13-1970           MRN: [id number] Visit Date: 11/29/2018 Requested by: [name], MD [place],  [place] [address] PCP: [name], MD  Subjective: Chief Complaint  Patient presents with  . Left Hand - Pain    HPI: Alison Farmer is a 48 y.o. female who presents to the office complaining of left thumb pain.  Patient reports left thumb pain for several months.  She notes pain and tenderness to the palmar left thumb.  Her pain is worse with triggering of the thumb.  She has been trying to keep the thumb straight with a bandage in order to prevent it from triggering.  She has never had a trigger finger before.  She has no history of diabetes or prior injections into the thumb.  Her symptoms wake her up at night.              ROS:  All systems reviewed are negative as they relate to the chief complaint within the history of present illness.  Patient denies fevers or chills.  Assessment & Plan: Visit Diagnoses:  1. Chronic pain of left thumb     Plan: Patient is a 48 year old female who presents with left trigger thumb.  Her symptoms have been going on for about 3 months.  She is waking up at night with pain.  Discussed options including anti-inflammatory medication versus cortisone injection versus surgical release of the A1 pulley.  Patient states she wants to try pills before proceeding to injection.  Oral diclofenac prescribed and patient recommended to try topical diclofenac as well.  She will return in 6 weeks and we will decide whether or not to inject the thumb at that time.  Follow-Up Instructions: No follow-ups on file.   Orders:  Orders Placed This Encounter  Procedures  . XR Finger Thumb Left   Meds ordered this encounter  Medications  . diclofenac (VOLTAREN) 50 MG EC tablet    Sig: Take 1 tablet (50 mg total) by mouth 2 (two) times daily for 14 days,  THEN 1 tablet (50 mg total) daily for 14 days, THEN 1 tablet (50 mg total) daily as needed for up to 14 days.    Dispense:  56 tablet    Refill:  0      Procedures: No procedures performed   Clinical Data: No additional findings.  Objective: Vital Signs: There were no vitals taken for this visit.  Physical Exam:  Constitutional: Patient appears well-developed HEENT:  Head: Normocephalic Eyes:EOM are normal Neck: Normal range of motion Cardiovascular: Normal rate Pulmonary/chest: Effort normal Neurologic: Patient is alert Skin: Skin is warm Psychiatric: Patient has normal mood and affect  Ortho Exam:  Left hand exam TTP over the A1 pulley of the left thumb TTP over the A1 pulley of the left ring finger No tenderness elsewhere Triggering exhibited of only the left thumb when patient slowly opens her hand from a fist. Sensation intact 2+ radial pulse Negative Finkelstein's test.  No tenderness over the first compartment.  No pain with thumb circumduction.  Specialty Comments:  No specialty comments available.  Imaging: No results found.   PMFS History: Patient Active Problem List   Diagnosis Date Noted  . Food allergy 10/06/2016  . Oral allergy syndrome, initial encounter 10/06/2016  . Allergic conjunctivitis 10/06/2016  . Spider  varicose veins 05/21/2010  . Skin nodule 05/21/2010  . 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 BACK PAIN, LUMBAR 01/04/2007  . SYMPTOM, INCONTINENCE, MIXED, URGE/STRESS 01/04/2007  . ANXIETY 05/14/2006  . Allergic rhinitis 05/14/2006   Past Medical History:  Diagnosis Date  . Anxiety associated with depression   . Hyperpigmentation    of tongue  . Seasonal allergies     Family History  Problem Relation Age of Onset  . Heart disease Mother 6542       often fainted, had palpitations  . Heart disease Father   . Heart disease Sister        younger sister with palpitations/heart skipping beats requiring hospitalizations and meds  . Allergic  rhinitis Neg Hx   . Angioedema Neg Hx   . Eczema Neg Hx   . Asthma Neg Hx     Past Surgical History:  Procedure Laterality Date  . CESAREAN SECTION    . HYSTEROSCOPY  06/15/09   IUD removal   Social History   Occupational History  . Not on file  Tobacco Use  . Smoking status: Never Smoker  . Smokeless tobacco: Never Used  Substance and Sexual Activity  . Alcohol use: No  . Drug use: No  . Sexual activity: Not on file

## 2018-12-06 ENCOUNTER — Other Ambulatory Visit: Payer: Self-pay

## 2018-12-06 ENCOUNTER — Ambulatory Visit: Payer: Self-pay | Attending: Family Medicine | Admitting: Pharmacist

## 2018-12-06 ENCOUNTER — Telehealth: Payer: Self-pay | Admitting: Orthopedic Surgery

## 2018-12-06 VITALS — BP 112/74 | HR 109

## 2018-12-06 DIAGNOSIS — R030 Elevated blood-pressure reading, without diagnosis of hypertension: Secondary | ICD-10-CM

## 2018-12-06 MED ORDER — DICLOFENAC SODIUM 50 MG PO TBEC: DELAYED_RELEASE_TABLET | ORAL | 0 refills | Status: AC

## 2018-12-06 NOTE — Progress Notes (Signed)
   S:  PCP: Dr. Chapman Fitch    Patient arrives "feeling much better". Presents to the clinic for BP check.   Patient was referred and last seen by Primary Care Provider on 11/24/18 via telemedicine for thumb pain. I saw her on 11/05/18 for a BP check and her BP was good.  Patient does not take medications for BP.  Patient denies chest pain, dyspnea, HA or blurred vision. Denies BLE edema.  Current BP Medications include:  None   Antihypertensives tried in the past include: None  Dietary habits include: compliant with salt restriction; reports regular intake of caffeine  Exercise habits include: none outside of work since onset of the covid-19 pandemic Family / Social history:  - FHx: heart disease (mother, father, and sister) - Tobacco: never smoker - Alcohol: denies use  O:  L arm after 5 minutes rest: 112/74   Home BP readings: has not replaced her old wrist cuff; has not checked since last encounter with me  Last 3 Office BP readings: BP Readings from Last 3 Encounters:  11/05/18 107/75  10/29/18 (!) 130/95  02/04/18 123/81   BMET    Component Value Date/Time   NA 142 01/01/2018 0925   K 4.2 01/01/2018 0925   CL 106 01/01/2018 0925   CO2 22 01/01/2018 0925   GLUCOSE 85 01/01/2018 0925   GLUCOSE 134 (H) 08/08/2017 0031   BUN 8 01/01/2018 0925   CREATININE 0.67 01/01/2018 0925   CREATININE 0.70 07/08/2013 1541   CALCIUM 9.3 01/01/2018 0925   GFRNONAA 106 01/01/2018 0925   GFRAA 122 01/01/2018 0925    Renal function: CrCl cannot be calculated (Patient's most recent lab result is older than the maximum 21 days allowed.).  Clinical ASCVD: No  The 10-year ASCVD risk score Mikey Bussing DC Jr., et al., 2013) is: 0.6%   Values used to calculate the score:     Age: 48 years     Sex: Female     Is Non-Hispanic African American: No     Diabetic: No     Tobacco smoker: No     Systolic Blood Pressure: 536 mmHg     Is BP treated: No     HDL Cholesterol: 46 mg/dL     Total  Cholesterol: 154 mg/dL  A/P: Hypertension undiagnosed. BP is normal. Encouraged patient to obtain new BP cuff for home use. We can recheck again in 1 month.  -No medications needed at this time.  -Counseled on lifestyle modifications for blood pressure control including reduced dietary sodium, increased exercise, adequate sleep -HM: flu and tetanus shot indicated at this time; pt declined  Results reviewed and written information provided. Total time in face-to-face counseling 15 minutes.   F/U w/ PCP.  Benard Halsted, PharmD, [place] [phone]

## 2018-12-06 NOTE — Patient Instructions (Signed)
Thank you for coming to see us today.   Blood pressure today is normal.  Limiting salt and caffeine, as well as exercising as able for at least 30 minutes for 5 days out of the week, can also help you lower your blood pressure.  Take your blood pressure at home if you are able. Please write down these numbers and bring them to your visits.  If you have any questions about medications, please call me [phone].  Luke  

## 2018-12-06 NOTE — Telephone Encounter (Signed)
IC no answer. LMVM advising this was sent to pharmacy.

## 2018-12-06 NOTE — Telephone Encounter (Signed)
Patient came in asking if the pain medicine was sent to the pharmacy for her thumb?  The number to contact patient is [phone]

## 2018-12-24 ENCOUNTER — Encounter: Payer: Self-pay | Admitting: Family Medicine

## 2018-12-24 ENCOUNTER — Other Ambulatory Visit: Payer: Self-pay

## 2018-12-24 ENCOUNTER — Ambulatory Visit (HOSPITAL_BASED_OUTPATIENT_CLINIC_OR_DEPARTMENT_OTHER): Payer: Self-pay | Admitting: Family Medicine

## 2019-01-20 ENCOUNTER — Other Ambulatory Visit: Payer: Self-pay

## 2019-01-20 ENCOUNTER — Encounter: Payer: Self-pay | Admitting: Family Medicine

## 2019-01-20 ENCOUNTER — Ambulatory Visit: Payer: Self-pay | Attending: Family Medicine | Admitting: Family Medicine

## 2019-01-20 DIAGNOSIS — M8949 Other hypertrophic osteoarthropathy, multiple sites: Secondary | ICD-10-CM

## 2019-01-20 DIAGNOSIS — R8290 Unspecified abnormal findings in urine: Secondary | ICD-10-CM

## 2019-01-20 DIAGNOSIS — R350 Frequency of micturition: Secondary | ICD-10-CM

## 2019-01-20 DIAGNOSIS — N889 Noninflammatory disorder of cervix uteri, unspecified: Secondary | ICD-10-CM

## 2019-01-20 DIAGNOSIS — Z789 Other specified health status: Secondary | ICD-10-CM

## 2019-01-20 DIAGNOSIS — M159 Polyosteoarthritis, unspecified: Secondary | ICD-10-CM

## 2019-01-20 DIAGNOSIS — Z603 Acculturation difficulty: Secondary | ICD-10-CM

## 2019-01-20 DIAGNOSIS — R1022 Pelvic and perineal pain left side: Secondary | ICD-10-CM

## 2019-01-20 DIAGNOSIS — R102 Pelvic and perineal pain: Secondary | ICD-10-CM

## 2019-01-20 DIAGNOSIS — Z758 Other problems related to medical facilities and other health care: Secondary | ICD-10-CM

## 2019-01-20 DIAGNOSIS — M150 Primary generalized (osteo)arthritis: Secondary | ICD-10-CM

## 2019-01-20 LAB — POCT URINALYSIS DIP (CLINITEK)
Bilirubin, UA: NEGATIVE
Glucose, UA: NEGATIVE mg/dL
Ketones, POC UA: NEGATIVE mg/dL
Leukocytes, UA: NEGATIVE
Nitrite, UA: NEGATIVE
POC PROTEIN,UA: NEGATIVE
Spec Grav, UA: 1.025
Urobilinogen, UA: 0.2 U/dL
pH, UA: 5.5

## 2019-01-20 MED ORDER — DICLOFENAC SODIUM 50 MG PO TBEC: 50.0000 mg | DELAYED_RELEASE_TABLET | Freq: Two times a day (BID) | ORAL | 5 refills | Status: DC

## 2019-01-20 MED ORDER — CEPHALEXIN 500 MG PO CAPS: 500.0000 mg | ORAL_CAPSULE | Freq: Two times a day (BID) | ORAL | 0 refills | Status: AC

## 2019-01-20 NOTE — Progress Notes (Signed)
Patient verified DOB Patient has not eaten today. Patient has not taken medication today. Patient denies pain at this time. 
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
## 2019-01-21 ENCOUNTER — Other Ambulatory Visit: Payer: Self-pay | Admitting: *Deleted

## 2019-01-21 DIAGNOSIS — R102 Pelvic and perineal pain: Secondary | ICD-10-CM

## 2019-01-21 LAB — BASIC METABOLIC PANEL WITH GFR
BUN/Creatinine Ratio: 14 (ref 9–23)
BUN: 9 mg/dL (ref 6–24)
CO2: 23 mmol/L (ref 20–29)
Calcium: 10 mg/dL (ref 8.7–10.2)
Chloride: 105 mmol/L (ref 96–106)
Creatinine, Ser: 0.65 mg/dL (ref 0.57–1.00)
GFR calc Af Amer: 121 mL/min/1.73
GFR calc non Af Amer: 105 mL/min/1.73
Glucose: 95 mg/dL (ref 65–99)
Potassium: 4.3 mmol/L (ref 3.5–5.2)
Sodium: 141 mmol/L (ref 134–144)

## 2019-01-21 LAB — CBC
Hematocrit: 37.4 % (ref 34.0–46.6)
Hemoglobin: 12.2 g/dL (ref 11.1–15.9)
MCH: 25.8 pg — ABNORMAL LOW (ref 26.6–33.0)
MCHC: 32.6 g/dL (ref 31.5–35.7)
MCV: 79 fL (ref 79–97)
Platelets: 216 x10E3/uL (ref 150–450)
RBC: 4.72 x10E6/uL (ref 3.77–5.28)
RDW: 13.7 % (ref 11.7–15.4)
WBC: 5.8 x10E3/uL (ref 3.4–10.8)

## 2019-01-22 LAB — URINE CULTURE

## 2019-01-24 LAB — CERVICOVAGINAL ANCILLARY ONLY
Bacterial Vaginitis (gardnerella): NEGATIVE
Candida Glabrata: NEGATIVE
Candida Vaginitis: NEGATIVE
Chlamydia: NEGATIVE
Comment: NEGATIVE
Comment: NEGATIVE
Comment: NEGATIVE
Comment: NEGATIVE
Comment: NEGATIVE
Comment: NORMAL
Neisseria Gonorrhea: NEGATIVE
Trichomonas: NEGATIVE

## 2019-01-27 LAB — CYTOLOGY - PAP
Comment: NEGATIVE
Comment: NEGATIVE
Diagnosis: NEGATIVE
HSV1: NEGATIVE
HSV2: NEGATIVE
High risk HPV: NEGATIVE

## 2019-02-14 ENCOUNTER — Ambulatory Visit (HOSPITAL_COMMUNITY): Payer: Self-pay

## 2019-03-24 ENCOUNTER — Ambulatory Visit (INDEPENDENT_AMBULATORY_CARE_PROVIDER_SITE_OTHER): Payer: Self-pay | Admitting: Otolaryngology

## 2019-04-27 ENCOUNTER — Other Ambulatory Visit: Payer: Self-pay

## 2019-04-27 ENCOUNTER — Ambulatory Visit: Payer: HRSA Program | Attending: Family Medicine | Admitting: Physician Assistant

## 2019-04-27 DIAGNOSIS — U071 COVID-19: Secondary | ICD-10-CM

## 2019-04-27 DIAGNOSIS — Z789 Other specified health status: Secondary | ICD-10-CM

## 2019-04-27 DIAGNOSIS — Z758 Other problems related to medical facilities and other health care: Secondary | ICD-10-CM

## 2019-04-27 MED ORDER — BENZONATATE 100 MG PO CAPS: 200.0000 mg | ORAL_CAPSULE | Freq: Three times a day (TID) | ORAL | 0 refills | Status: DC | PRN

## 2019-04-27 MED ORDER — AZITHROMYCIN 250 MG PO TABS: ORAL_TABLET | ORAL | 0 refills | Status: DC

## 2019-04-27 MED ORDER — ALBUTEROL SULFATE HFA 108 (90 BASE) MCG/ACT IN AERS: 2.0000 | INHALATION_SPRAY | Freq: Four times a day (QID) | RESPIRATORY_TRACT | 0 refills | Status: DC | PRN

## 2019-04-27 NOTE — Progress Notes (Signed)
COVID testing Confirmed on 04/07/2019 C /o semi productive coughing, particularly at night , nasal congestion, aches on the back and front of mid chest cavity. Last fever was recorded on 04/11/2019  Stated Husband tested positive as well on the same day  Tried OTC meds/ feeling better

## 2019-04-27 NOTE — Progress Notes (Signed)
Patient ID: Alison Farmer, female   DOB: 04/22/1970, 49 y.o.   MRN: [id number] Virtual Visit via Telephone Note  I connected with Alison Farmer on 04/27/19 at  2:10 PM EST by telephone and verified that I am speaking with the correct person using two identifiers.   I discussed the limitations, risks, security and privacy concerns of performing an evaluation and management service by telephone and the availability of in person appointments. I also discussed with the patient that there may be a patient responsible charge related to this service. The patient expressed understanding and agreed to proceed.  PATIENT visit by telephone virtually in the context of Covid-19 pandemic. Patient location:  home My Location:  [facility] office Persons on the call:  Me, the patient, and Victor(interpreter)   History of Present Illness: Recent Covid+ at [facility] on 04/07/2019.  Last fever 04/11/2019.  Still having some cough. Some yellowish mucus. No N/V/D.  Some wheezing.  Feeling better overall.   No respiratory distress.  Husband was also sick but had much milder case than her and is already back to work.      Observations/Objective: NAD.  A&Ox3   Assessment and Plan: 1. COVID-19 Will cover for atypicals as secondary infection - benzonatate (TESSALON) 100 MG capsule; Take 2 capsules (200 mg total) by mouth 3 (three) times daily as needed. Prn cough  Dispense: 40 capsule; Refill: 0 - albuterol (VENTOLIN HFA) 108 (90 Base) MCG/ACT inhaler; Inhale 2 puffs into the lungs every 6 (six) hours as needed for wheezing or shortness of breath.  Dispense: 18 g; Refill: 0 - azithromycin (ZITHROMAX) 250 MG tablet; Take 2 today then 1 daily  Dispense: 6 tablet; Refill: 0  2. Language barrier pacific interpreters used and additional time performing visit was required.     Follow Up Instructions: prn   I discussed the assessment and treatment plan with the patient. The patient was provided an  opportunity to ask questions and all were answered. The patient agreed with the plan and demonstrated an understanding of the instructions.   The patient was advised to call back or seek an in-person evaluation if the symptoms worsen or if the condition fails to improve as anticipated.  I provided 16 minutes of non-face-to-face time during this encounter.   Georgian Co, PA-C

## 2019-04-29 ENCOUNTER — Ambulatory Visit: Payer: Self-pay | Admitting: Family Medicine

## 2019-06-09 ENCOUNTER — Encounter: Payer: Self-pay | Admitting: Family Medicine

## 2019-06-09 ENCOUNTER — Other Ambulatory Visit: Payer: Self-pay

## 2019-06-09 ENCOUNTER — Ambulatory Visit: Payer: Self-pay | Attending: Family Medicine | Admitting: Family Medicine

## 2019-06-09 DIAGNOSIS — R05 Cough: Secondary | ICD-10-CM

## 2019-06-09 DIAGNOSIS — Z8616 Personal history of COVID-19: Secondary | ICD-10-CM

## 2019-06-09 DIAGNOSIS — Z91018 Allergy to other foods: Secondary | ICD-10-CM

## 2019-06-09 DIAGNOSIS — R058 Other specified cough: Secondary | ICD-10-CM

## 2019-06-09 MED ORDER — AZITHROMYCIN 250 MG PO TABS: ORAL_TABLET | ORAL | 0 refills | Status: DC

## 2019-06-09 NOTE — Progress Notes (Signed)
Pain in throat at night and its causing her to get headaches and back pain. Wants referral to a specialist.   Patient wants to know if she can have the covid vaccine

## 2019-06-09 NOTE — Progress Notes (Signed)
Virtual Visit via Telephone Note  I connected with Alison Farmer  on 06/09/19 at 10:50 AM EDT by telephone and verified that I am speaking with the correct person using two identifiers.   I discussed the limitations, risks, security and privacy concerns of performing an evaluation and management service by telephone and the availability of in person appointments. I also discussed with the patient that there may be a patient responsible charge related to this service. The patient expressed understanding and agreed to proceed.  Patient Location: home Provider Location: [facility] Office Others participating in call: call initiated by Guillermina [place], RMA who obtained a Spanish speaking interpreter Jerene Dilling ID# [id number] through [facility], [facility], [facility]   History of Present Illness:       49 yo female with complaint of having COVID-19, diagnosed Mar 22, 2019. Since then she has had a continued cough with phlegm, right sided throat pain, back pain, and headaches.  She reports that previously, her phlegm was brown but now clear.  She is taking medication to help with allergies and mucus.  She states that her symptoms are no longer in her nose but mostly in her throat.  She reports frontal headache, mild which has occurred for the past week.  She has also had recurrent back pain since her Covid diagnosis along with the recurrent cough.  She reports that she would like to see Dr. Dillard Cannon about her cough, food allergies and to see if she can take the COVID-19 vaccine.  She denies any current fever but did have chills and increased fatigue about 2 days ago.  She also has right-sided sore throat.   Past Medical History:  Diagnosis Date  . Anxiety associated with depression   . Hyperpigmentation    of tongue  . Seasonal allergies     Past Surgical History:  Procedure Laterality Date  . CESAREAN SECTION    . HYSTEROSCOPY  06/15/09   IUD removal    Family History  Problem  Relation Age of Onset  . Heart disease Mother 20       often fainted, had palpitations  . Heart disease Father   . Heart disease Sister        younger sister with palpitations/heart skipping beats requiring hospitalizations and meds  . Allergic rhinitis Neg Hx   . Angioedema Neg Hx   . Eczema Neg Hx   . Asthma Neg Hx     Social History   Tobacco Use  . Smoking status: Never Smoker  . Smokeless tobacco: Never Used  Substance Use Topics  . Alcohol use: No  . Drug use: No     Allergies  Allergen Reactions  . Avocado Itching       Observations/Objective: No vital signs or physical exam conducted as visit was done via telephone  Assessment and Plan: 1. History of 2019 novel coronavirus disease (COVID-19) 2. Recurrent productive cough She reports prior history of COVID-19 and now continues to have productive cough.  She has been asked to obtain a chest x-ray and additionally, referral is being placed to an allergist per patient request due to her recurrent cough.  She reports that she has spoken with the office of Dr. Dillard Cannon to whom she would like to be referred.  If she has any acute worsening of shortness of breath, difficulty breathing, chest pain or any other concerns she should go to the emergency department for further evaluation and treatment in the meantime. - Ambulatory referral to Allergy -  DG Chest 2 View; Future  3. Multiple food allergies She reports allergies to avocado and other foods and will be referred to an allergist for further evaluation - Ambulatory referral to Allergy  Follow Up Instructions:Return in about 3 weeks (around 06/30/2019) for Recurrent cough.    I discussed the assessment and treatment plan with the patient. The patient was provided an opportunity to ask questions and all were answered. The patient agreed with the plan and demonstrated an understanding of the instructions.   The patient was advised to call back or seek an  in-person evaluation if the symptoms worsen or if the condition fails to improve as anticipated.  I provided 18 minutes of non-face-to-face time during this encounter.   [name], MD

## 2019-06-15 ENCOUNTER — Encounter (INDEPENDENT_AMBULATORY_CARE_PROVIDER_SITE_OTHER): Payer: Self-pay | Admitting: Otolaryngology

## 2019-06-15 ENCOUNTER — Other Ambulatory Visit: Payer: Self-pay

## 2019-06-15 ENCOUNTER — Ambulatory Visit (INDEPENDENT_AMBULATORY_CARE_PROVIDER_SITE_OTHER): Payer: Self-pay | Admitting: Otolaryngology

## 2019-06-15 VITALS — Temp 97.3°F

## 2019-06-15 DIAGNOSIS — J310 Chronic rhinitis: Secondary | ICD-10-CM

## 2019-06-15 DIAGNOSIS — R05 Cough: Secondary | ICD-10-CM

## 2019-06-15 DIAGNOSIS — R053 Chronic cough: Secondary | ICD-10-CM
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
## 2019-06-19 ENCOUNTER — Encounter: Payer: Self-pay | Admitting: Family Medicine

## 2019-06-28 ENCOUNTER — Telehealth: Payer: Self-pay | Admitting: Family Medicine

## 2019-06-28 NOTE — Telephone Encounter (Signed)
1) Medication(s) Requested (by name): -fexofenadine (ALLEGRA ALLERGY) 180 MG tablet   2) Pharmacy of Choice: [facility]

## 2019-07-01 NOTE — Telephone Encounter (Signed)
We do not fill this as a prescription. Pt will need to purchase this OTC.

## 2019-10-03 IMAGING — CR DG CHEST 2V
2 series · 2 of 2 positions shown · non-contrast
Comparison: None.

CLINICAL DATA: Fever

EXAM:
CHEST - 2 VIEW

[w chest pa]
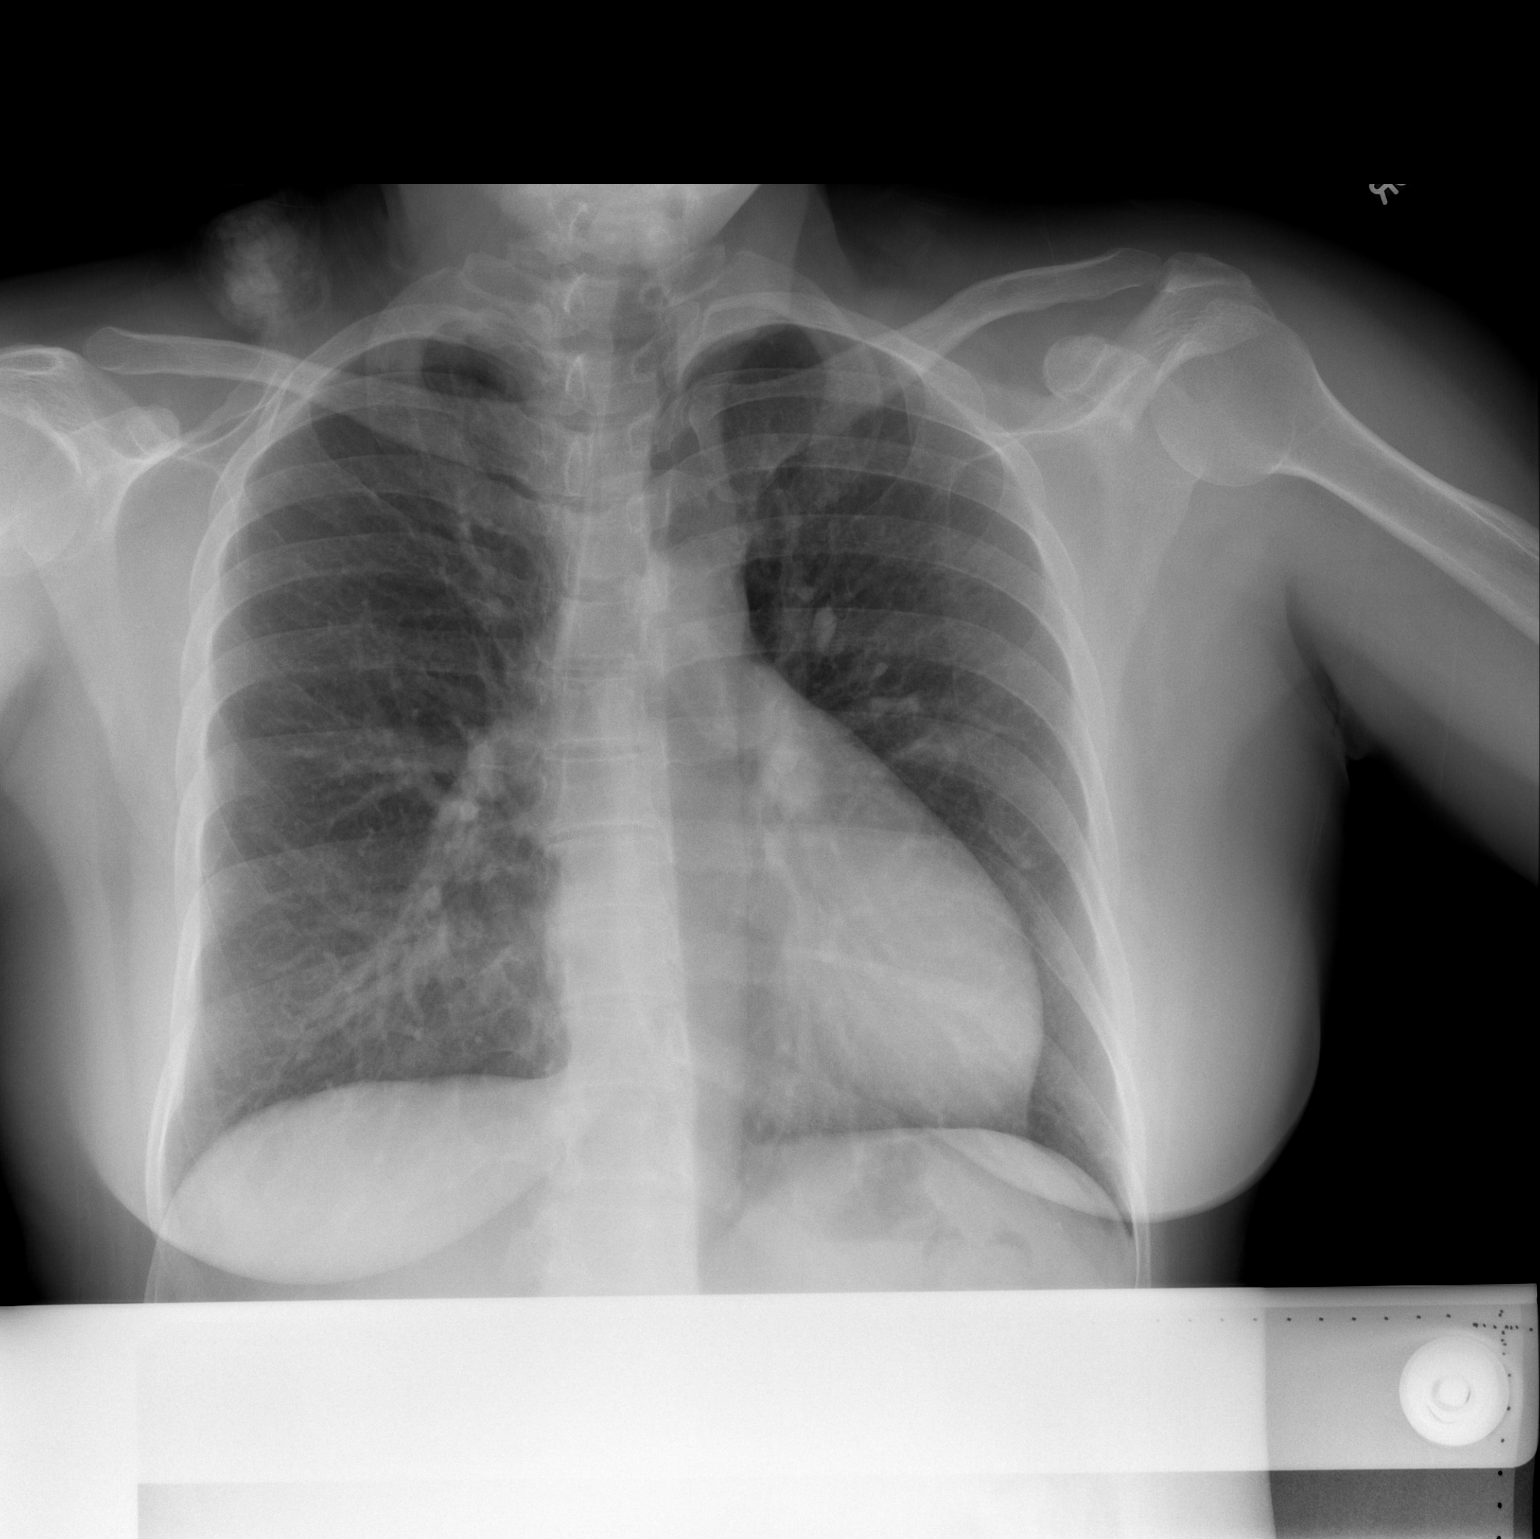

[w chest lat]
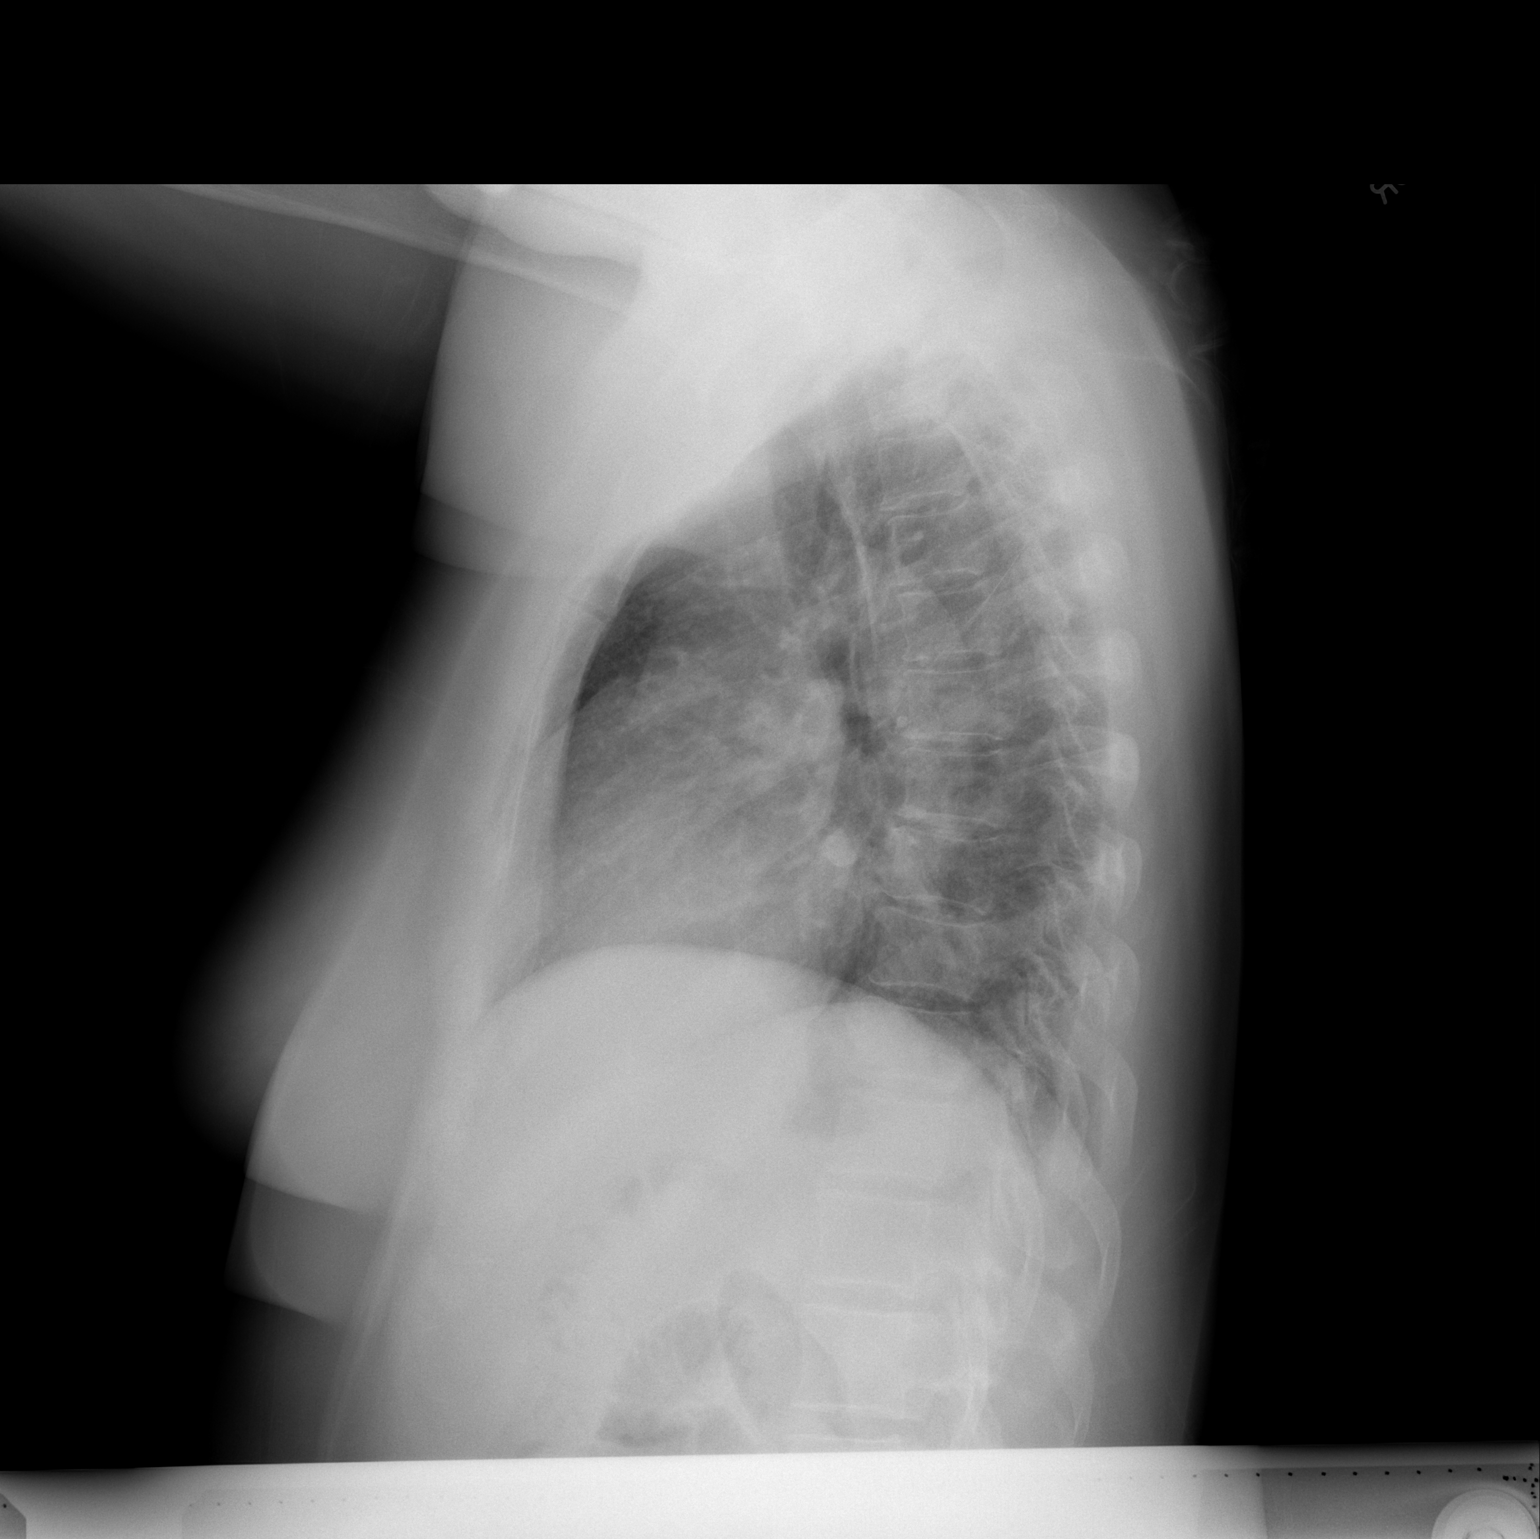

[2 of 2 positions shown; findings below may reference images not displayed]

FINDINGS: The heart size and mediastinal contours are within normal limits.
Both lungs are clear. The visualized skeletal structures are
unremarkable.
IMPRESSION: No active cardiopulmonary disease.

## 2019-11-06 NOTE — Progress Notes (Deleted)
   Subjective:    Patient ID: Alison Farmer, female    DOB: February 18, 1971, 49 y.o.   MRN: [id number]  HPI    Review of Systems     Objective:   Physical Exam        Assessment & Plan:

## 2019-11-07 ENCOUNTER — Ambulatory Visit: Payer: Self-pay | Admitting: Critical Care Medicine

## 2019-12-20 ENCOUNTER — Ambulatory Visit: Payer: Self-pay | Admitting: Critical Care Medicine

## 2019-12-26 ENCOUNTER — Ambulatory Visit: Payer: Self-pay | Attending: Critical Care Medicine | Admitting: Critical Care Medicine

## 2019-12-26 ENCOUNTER — Other Ambulatory Visit: Payer: Self-pay

## 2019-12-26 ENCOUNTER — Encounter: Payer: Self-pay | Admitting: Critical Care Medicine

## 2019-12-26 VITALS — BP 109/74 | HR 83 | Temp 97.2°F | Wt 172.0 lb

## 2019-12-26 DIAGNOSIS — M940 Chondrocostal junction syndrome [Tietze]: Secondary | ICD-10-CM

## 2019-12-26 DIAGNOSIS — Z114 Encounter for screening for human immunodeficiency virus [HIV]: Secondary | ICD-10-CM

## 2019-12-26 DIAGNOSIS — Z1159 Encounter for screening for other viral diseases: Secondary | ICD-10-CM

## 2019-12-26 DIAGNOSIS — R1032 Left lower quadrant pain: Secondary | ICD-10-CM

## 2019-12-26 DIAGNOSIS — R072 Precordial pain: Secondary | ICD-10-CM | POA: Insufficient documentation

## 2019-12-26 MED ORDER — MELOXICAM 7.5 MG PO TABS: 7.5000 mg | ORAL_TABLET | Freq: Every day | ORAL | 0 refills | Status: DC

## 2019-12-26 NOTE — Assessment & Plan Note (Signed)
Costochondritis without evidence of acute cardiac pain  Begin meloxicam 7.5 mg daily also gave patient on the AVS advice as to how to manage the costochondral pain
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
## 2019-12-26 NOTE — Patient Instructions (Addendum)
Labs today include complete blood count metabolic panel HIV hepatitis C assay'   begin meloxicam 1 tablet daily for pain in the abdomen and chest  An abdominal ultrasound will be obtained, we will call you results   EKG was performed and was normal  Return to see Dr. Delford Field in 2 weeks  Also you can apply heat to the anterior chest to help with the pain   Costocondritis Costochondritis La costocondritis es la hinchazn e irritacin (inflamacin) del tejido Building surveyor) que une las costillas con el esternn. Esto causa dolor en la parte frontal del pecho. Por lo general, el dolor tiene las siguientes caractersticas:  Comienza de forma gradual.  Se manifiesta en ms de [employer] Generalmente, esta afeccin desaparece con el paso tiempo, sin tratamiento. Siga estas indicaciones en su casa:  No haga nada que intensifique el dolor.  Si se lo indican, aplique hielo sobre la zona del dolor: ? Nature conservation officer hielo en una bolsa plstica. ? Coloque una [employer] piel y la bolsa de hielo. ? Coloque el hielo durante , 2 o 3veces por da.  Si se lo indican, aplique calor en la zona afectada con la frecuencia que le indique el mdico. Use la fuente de calor que el mdico le indique, por ejemplo, una compresa de calor hmedo o una almohadilla trmica. ? Colquese una [employer] piel y la fuente de Airline pilot. ? Aplique el calor durante 20 a . ? Retire la fuente de calor si la piel se le pone de color rojo brillante. Esto es muy importante si no puede sentir el dolor, el calor o el fro. Puede correr un riesgo mayor de sufrir quemaduras.  Tome los medicamentos de venta libre y los recetados solamente como se lo haya indicado el mdico.  Retome sus actividades habituales como se lo haya indicado el mdico. Pregntele al mdico qu actividades son seguras para usted.  Concurra a todas las visitas de control como se lo haya indicado el mdico. Esto es  importante. Comunquese con un mdico si:  Tiene escalofros o fiebre.  El dolor persiste o [place].  Tiene tos que no desaparece. Solicite ayuda de inmediato si:  Le falta el aire. Esta informacin no tiene Theme park manager el consejo del mdico. Asegrese de hacerle al mdico cualquier pregunta que tenga. Document Revised: 06/05/2016 Document Reviewed: 06/27/2015 Elsevier Patient Education  2020 [employer].

## 2019-12-26 NOTE — Assessment & Plan Note (Signed)
Left lower quadrant abdominal pain unclear etiology  Obtain metabolic profile CBC and abdominal ultrasound

## 2019-12-27 LAB — CBC WITH DIFFERENTIAL/PLATELET
Basophils Absolute: 0.1 10*3/uL (ref 0.0–0.2)
Basos: 1 %
EOS (ABSOLUTE): 0.3 10*3/uL (ref 0.0–0.4)
Eos: 5 %
Hematocrit: 36.4 % (ref 34.0–46.6)
Hemoglobin: 12.1 g/dL (ref 11.1–15.9)
Immature Grans (Abs): 0 10*3/uL (ref 0.0–0.1)
Immature Granulocytes: 0 %
Lymphocytes Absolute: 2.5 10*3/uL (ref 0.7–3.1)
Lymphs: 44 %
MCH: 26.5 pg — ABNORMAL LOW (ref 26.6–33.0)
MCHC: 33.2 g/dL (ref 31.5–35.7)
MCV: 80 fL (ref 79–97)
Monocytes Absolute: 0.3 10*3/uL (ref 0.1–0.9)
Monocytes: 5 %
Neutrophils Absolute: 2.6 10*3/uL (ref 1.4–7.0)
Neutrophils: 45 %
Platelets: 174 10*3/uL (ref 150–450)
RBC: 4.57 x10E6/uL (ref 3.77–5.28)
RDW: 13.9 % (ref 11.7–15.4)
WBC: 5.8 10*3/uL (ref 3.4–10.8)

## 2019-12-27 LAB — COMPREHENSIVE METABOLIC PANEL
ALT: 13 IU/L (ref 0–32)
AST: 16 IU/L (ref 0–40)
Albumin/Globulin Ratio: 1.6 (ref 1.2–2.2)
Albumin: 4.6 g/dL (ref 3.8–4.8)
Alkaline Phosphatase: 122 IU/L — ABNORMAL HIGH (ref 44–121)
BUN/Creatinine Ratio: 12 (ref 9–23)
BUN: 8 mg/dL (ref 6–24)
Bilirubin Total: 0.2 mg/dL (ref 0.0–1.2)
CO2: 25 mmol/L (ref 20–29)
Calcium: 9.3 mg/dL (ref 8.7–10.2)
Chloride: 106 mmol/L (ref 96–106)
Creatinine, Ser: 0.65 mg/dL (ref 0.57–1.00)
GFR calc Af Amer: 121 mL/min/{1.73_m2} (ref >59)
GFR calc non Af Amer: 105 mL/min/{1.73_m2} (ref >59)
Globulin, Total: 2.9 g/dL (ref 1.5–4.5)
Glucose: 93 mg/dL (ref 65–99)
Potassium: 4.6 mmol/L (ref 3.5–5.2)
Sodium: 141 mmol/L (ref 134–144)
Total Protein: 7.5 g/dL (ref 6.0–8.5)

## 2019-12-27 LAB — HCV INTERPRETATION

## 2019-12-27 LAB — HIV ANTIBODY (ROUTINE TESTING W REFLEX): HIV Screen 4th Generation wRfx: NONREACTIVE

## 2019-12-27 LAB — HCV AB W/RFLX TO VERIFICATION: HCV Ab: <0.1 s/co ratio (ref 0.0–0.9)

## 2019-12-27 NOTE — Progress Notes (Signed)
Normal result letter created and mailed

## 2020-01-17 ENCOUNTER — Ambulatory Visit: Payer: Self-pay | Admitting: Critical Care Medicine
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
## 2020-02-20 ENCOUNTER — Encounter: Payer: Self-pay | Admitting: Physician Assistant

## 2020-02-20 ENCOUNTER — Ambulatory Visit: Payer: Self-pay | Attending: Physician Assistant | Admitting: Physician Assistant

## 2020-02-20 ENCOUNTER — Other Ambulatory Visit: Payer: Self-pay

## 2020-02-20 VITALS — BP 125/75 | HR 79 | Temp 97.9°F | Ht 60.67 in | Wt 169.1 lb

## 2020-02-20 DIAGNOSIS — R1032 Left lower quadrant pain: Secondary | ICD-10-CM

## 2020-02-20 DIAGNOSIS — Z789 Other specified health status: Secondary | ICD-10-CM

## 2020-02-20 DIAGNOSIS — R1012 Left upper quadrant pain: Secondary | ICD-10-CM

## 2020-02-20 DIAGNOSIS — M940 Chondrocostal junction syndrome [Tietze]: Secondary | ICD-10-CM

## 2020-02-20 NOTE — Progress Notes (Signed)
Tingling/burning on left side Feels like its her kidneys Doesn't drink a lot of water Symptoms on and off for about 6 months  Feels most pain at night

## 2020-02-20 NOTE — Progress Notes (Signed)
Alison Farmer, is a 49 y.o. female  DUK:[id number]  BJS:[id number]  DOB - 10-Feb-1971  Subjective:  Chief Complaint and HPI: Alison Farmer is a 49 y.o. female here today still having LUQ and L middle side pain.  No urinary s/sx.  No vaginal discharge.  Some burning and stinging of skin on L abdomen.  Saw Dr Delford Field in October and had previous orders placed for abd U/S and CXR but she never went for these/they were never scheduled.  No cough.  Previous labs in work up CMP, CBC, HIV, HCV all WNL.  No N/V/D.  No constipation.  Appetite is good.  No periods in a few years.    ROS:   Constitutional:  No f/c, No night sweats, No unexplained weight loss. EENT:  No vision changes, No blurry vision, No hearing changes. No mouth, throat, or ear problems.  Respiratory: No cough, No SOB Cardiac: No CP, no palpitations GI: see above GU: No Urinary s/sx Musculoskeletal: No joint pain Neuro: No headache, no dizziness, no motor weakness.  Skin: No rash Endocrine:  No polydipsia. No polyuria.  Psych: Denies SI/HI  No problems updated.  ALLERGIES: Allergies  Allergen Reactions  . Avocado Itching    PAST MEDICAL HISTORY: Past Medical History:  Diagnosis Date  . Hyperpigmentation    of tongue  . Seasonal allergies     MEDICATIONS AT HOME: Prior to Admission medications   Medication Sig Start Date End Date Taking? Authorizing Provider  meloxicam (MOBIC) 7.5 MG tablet Take 1 tablet (7.5 mg total) by mouth daily. 12/26/19  Yes Storm Frisk, MD     Objective:  EXAM:   Vitals:   02/20/20 0925  BP: 125/75  Pulse: 79  Temp: 97.9 F (36.6 C)  SpO2: 98%  Weight: 169 lb 1.6 oz (76.7 kg)  Height: 5' 0.67" (1.541 m)    General appearance : A&OX3. NAD. Non-toxic-appearing;  Speaks broken but fair english HEENT: Atraumatic and Normocephalic.  PERRLA. EOM intact.   Chest/Lungs:  Breathing-non-labored, Good air entry bilaterally, breath sounds normal  without rales, rhonchi, or wheezing  CVS: S1 S2 regular, no murmurs, gallops, rubs  Abdomen: Bowel sounds present, Non tender and not distended with no gaurding, rigidity or rebound. Extremities: Bilateral Lower Ext shows no edema, both legs are warm to touch with = pulse throughout Neurology:  CN II-XII grossly intact, Non focal.   Psych:  TP linear. J/I WNL. Normal speech. Appropriate eye contact and affect.  Skin:  No Rash  Data Review Lab Results  Component Value Date   HGBA1C 5.6 09/05/2016     Assessment & Plan   1. LLQ abdominal pain Non-acute abdomen - US Abdomen Complete; Future - US Pelvic Complete With Transvaginal; Future  2. Language barrier Broken but fair english  3. Costochondritis Previous order palced - DG Chest 2 View; Future  4. LUQ pain See #1   Patient have been counseled extensively about nutrition and exercise  Return in about 3 months (around 05/20/2020) for assign new pcp.  The patient was given clear instructions to go to ER or return to medical center if symptoms don't improve, worsen or new problems develop. The patient verbalized understanding. The patient was told to call to get lab results if they haven't heard anything in the next week.     Georgian Co, PA-C [facility] [place], [place] [phone]   02/20/2020, 10:10 AMPatient ID: Alison Farmer, female   DOB: [birth year],  49 y.o.   MRN: [id number]

## 2020-02-23 ENCOUNTER — Encounter (HOSPITAL_COMMUNITY): Payer: Self-pay

## 2020-02-23 ENCOUNTER — Other Ambulatory Visit: Payer: Self-pay

## 2020-02-23 ENCOUNTER — Ambulatory Visit (HOSPITAL_COMMUNITY)
Admission: RE | Admit: 2020-02-23 | Discharge: 2020-02-23 | Disposition: A | Discharge status: Home / Self Care | Payer: Self-pay | Source: Ambulatory Visit | Attending: Physician Assistant | Admitting: Physician Assistant

## 2020-02-23 DIAGNOSIS — R1032 Left lower quadrant pain: Secondary | ICD-10-CM

## 2020-03-05 ENCOUNTER — Ambulatory Visit (HOSPITAL_COMMUNITY)
Admission: RE | Admit: 2020-03-05 | Discharge: 2020-03-05 | Disposition: A | Discharge status: Home / Self Care | Payer: Self-pay | Source: Ambulatory Visit | Attending: Physician Assistant | Admitting: Physician Assistant

## 2020-03-05 ENCOUNTER — Other Ambulatory Visit: Payer: Self-pay

## 2020-03-05 DIAGNOSIS — R1032 Left lower quadrant pain: Secondary | ICD-10-CM

## 2020-03-07 ENCOUNTER — Other Ambulatory Visit: Payer: Self-pay | Admitting: Physician Assistant

## 2020-03-07 DIAGNOSIS — K7689 Other specified diseases of liver: Secondary | ICD-10-CM

## 2020-10-12 ENCOUNTER — Ambulatory Visit (HOSPITAL_COMMUNITY): Admission: EM | Admit: 2020-10-12 | Discharge: 2020-10-12 | Discharge status: Against Medical Advice | Payer: Self-pay

## 2020-10-12 ENCOUNTER — Other Ambulatory Visit: Payer: Self-pay

## 2020-10-13 ENCOUNTER — Other Ambulatory Visit: Payer: Self-pay

## 2020-10-13 ENCOUNTER — Ambulatory Visit (HOSPITAL_COMMUNITY)
Admission: RE | Admit: 2020-10-13 | Discharge: 2020-10-13 | Disposition: A | Discharge status: Home / Self Care | Payer: Self-pay | Source: Ambulatory Visit | Attending: Student | Admitting: Student

## 2020-10-13 ENCOUNTER — Encounter (HOSPITAL_COMMUNITY): Payer: Self-pay

## 2020-10-13 VITALS — BP 122/53 | HR 91 | Temp 98.6°F | Resp 18

## 2020-10-13 DIAGNOSIS — H66001 Acute suppurative otitis media without spontaneous rupture of ear drum, right ear: Secondary | ICD-10-CM

## 2020-10-13 DIAGNOSIS — J209 Acute bronchitis, unspecified: Secondary | ICD-10-CM

## 2020-10-13 MED ORDER — PROMETHAZINE-DM 6.25-15 MG/5ML PO SYRP: 5.0000 mL | ORAL_SOLUTION | Freq: Four times a day (QID) | ORAL | 0 refills | Status: DC | PRN

## 2020-10-13 MED ORDER — AMOXICILLIN 875 MG PO TABS: 875.0000 mg | ORAL_TABLET | Freq: Two times a day (BID) | ORAL | 0 refills | Status: AC

## 2020-10-13 MED ORDER — PREDNISONE 20 MG PO TABS: 20.0000 mg | ORAL_TABLET | Freq: Every day | ORAL | 0 refills | Status: AC

## 2020-10-13 NOTE — ED Triage Notes (Signed)
Pt present coughing with SOB and congestion . Symptom started three weeks ago. Pt tried otc medication with no relief.

## 2020-10-13 NOTE — ED Provider Notes (Signed)
[facility]    CSN: [id number] Arrival date & time: 10/13/20  1406      History   Chief Complaint Chief Complaint  Patient presents with   Cough   Shortness of Breath    HPI Alison Farmer is a 50 y.o. female presenting with cough x3 weeks. Medical history allergies.  Endorses hacking productive cough of yellow sputum, worse at night for the last 3 weeks.  States cough is keeping her up at night.  Shortness of breath during coughing episodes.  Denies URI preceding symptoms. Denies fevers/chills, n/v/d, chest pain,, facial pain, teeth pain, headaches, sore throat, loss of taste/smell, swollen lymph nodes.  Also with right ear pain and tinnitus for about 1 week, with right-sided cervical lymphadenopathy.  Denies hearing changes, dizziness.     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      Home Medications    Prior to Admission medications   Medication Sig Start Date End Date Taking? Authorizing Provider  amoxicillin (AMOXIL) 875 MG tablet Take 1 tablet (875 mg total) by mouth 2 (two) times daily for 7 days. 10/13/20 10/20/20 Yes Rhys Martini, PA-C  predniSONE (DELTASONE) 20 MG tablet Take 1 tablet (20 mg total) by mouth daily for 5 days. 10/13/20 10/18/20 Yes Rhys Martini,  PA-C  promethazine-dextromethorphan (PROMETHAZINE-DM) 6.25-15 MG/5ML syrup Take 5 mLs by mouth 4 (four) times daily as needed for cough. 10/13/20  Yes Rhys Martini, PA-C  meloxicam (MOBIC) 7.5 MG tablet TAKE 1 TABLET (7.5 MG TOTAL) BY MOUTH DAILY. 12/26/19 12/25/20  Storm Frisk, MD    Family History Family History  Problem Relation Age of Onset   Heart disease Mother 5       often fainted, had palpitations   Heart disease Father    Heart disease Sister        younger sister with palpitations/heart skipping beats requiring hospitalizations and meds   Allergic rhinitis Neg Hx    Angioedema Neg Hx    Eczema Neg Hx    Asthma Neg Hx     Social History Social History   Tobacco Use   Smoking status: Never   Smokeless tobacco: Never  Vaping Use   Vaping Use: Never used  Substance Use Topics   Alcohol use: No   Drug use: No     Allergies   Avocado   Review of Systems Review of Systems  Constitutional:  Negative for appetite change, chills and fever.  HENT:  Positive for congestion. Negative for ear pain, rhinorrhea, sinus pressure, sinus pain and sore throat.   Eyes:  Negative for redness and visual disturbance.  Respiratory:  Positive for cough and shortness of breath. Negative for chest tightness and wheezing.   Cardiovascular:  Negative for chest pain and palpitations.  Gastrointestinal:  Negative for abdominal pain, constipation, diarrhea, nausea and vomiting.  Genitourinary:  Negative for dysuria, frequency and urgency.  Musculoskeletal:  Negative for myalgias.  Neurological:  Negative for dizziness, weakness and headaches.  Psychiatric/Behavioral:  Negative for confusion.   All other systems reviewed and are negative.   Physical Exam Triage Vital Signs ED Triage Vitals  Enc Vitals Group     BP 10/13/20 1428 (!) 122/53     Pulse Rate 10/13/20 1428 91     Resp 10/13/20 1428 18     Temp 10/13/20 1428 98.6 F (37 C)     Temp Source 10/13/20 1428 Oral      SpO2 10/13/20 1428 98 %     Weight --      Height --      Head Circumference --      Peak Flow --      Pain Score 10/13/20 1427 0     Pain Loc --      Pain Edu? --      Excl. in GC? --    No data found.  Updated Vital Signs BP (!) 122/53 (BP Location: Left Arm)   Pulse 91   Temp 98.6 F (37 C) (Oral)   Resp 18   LMP 01/03/2018 (LMP Unknown)   SpO2 98%   Visual Acuity Right Eye Distance:   Left Eye Distance:   Bilateral Distance:    Right Eye Near:   Left Eye Near:    Bilateral Near:     Physical Exam Vitals reviewed.  Constitutional:      General: She is not in acute distress.    Appearance: Normal appearance. She is not ill-appearing.  HENT:     Head: Normocephalic and atraumatic.     Right Ear: Ear canal and external ear normal. Tenderness present. No middle ear effusion. There is no impacted cerumen. Tympanic membrane is erythematous. Tympanic membrane is not perforated, retracted or bulging.     Left Ear: Tympanic membrane, ear canal and external ear normal. No tenderness.  No middle ear effusion. There is no impacted cerumen. Tympanic membrane is not perforated, erythematous, retracted or bulging.     Nose: Nose normal. No congestion.     Mouth/Throat:     Mouth: Mucous membranes are moist.     Pharynx: Uvula midline. No oropharyngeal exudate or posterior oropharyngeal erythema.  Eyes:     Extraocular Movements: Extraocular movements intact.     Pupils: Pupils are equal, round, and reactive to light.  Cardiovascular:     Rate and Rhythm: Normal rate and regular rhythm.     Heart sounds: Normal heart sounds.  Pulmonary:     Effort: Pulmonary effort is normal. No tachypnea, bradypnea, accessory muscle usage, prolonged expiration or respiratory distress.     Breath sounds: Wheezing present. No decreased breath sounds, rhonchi or rales.     Comments: Few expiratory wheezes lower lung fields  Abdominal:     Palpations: Abdomen is soft.     Tenderness: There is  no abdominal tenderness. There is no guarding or rebound.  Lymphadenopathy:     Cervical: Cervical adenopathy present.     Right cervical: Superficial cervical adenopathy present.  Neurological:     General: No focal deficit present.     Mental Status: She is alert and oriented to person, place, and time.  Psychiatric:        Mood and Affect: Mood normal.  Behavior: Behavior normal.        Thought Content: Thought content normal.        Judgment: Judgment normal.     UC Treatments / Results  Labs (all labs ordered are listed, but only abnormal results are displayed) Labs Reviewed - No data to display  EKG   Radiology No results found.  Procedures Procedures (including critical care time)  Medications Ordered in UC Medications - No data to display  Initial Impression / Assessment and Plan / UC Course  I have reviewed the triage vital signs and the nursing notes.  Pertinent labs & imaging results that were available during my care of the patient were reviewed by me and considered in my medical decision making (see chart for details).     This patient is a very pleasant 50 y.o. year old female presenting with acute bronchitis and R AOM. Afebrile, nontachy. Few expiratory wheezes but no rhonchi or rales, oxygenating well on room air.  Denies history pulmonary disease.  For bronchitis-prednisone and Promethazine DM. For AOM-amoxicillin.  She is postmenopausal.  Declines translation services as she speaks [place].  ED return precautions discussed. Patient verbalizes understanding and agreement.    Coding this visit a level 4 for acute illness with systemic symptoms and prescription drug management.  Final Clinical Impressions(s) / UC Diagnoses   Final diagnoses:  Acute bronchitis, unspecified organism  Non-recurrent acute suppurative otitis media of right ear without spontaneous rupture of tympanic membrane     Discharge Instructions      -Prednisone, 2  pills taken at the same time for 5 days in a row.  Try taking this earlier in the day as it can give you energy. Avoid NSAIDs like ibuprofen and alleve while taking this medication as they can increase your risk of stomach upset and even GI bleeding when in combination with a steroid. You can continue tylenol (acetaminophen) up to 1000mg  3x daily. -Promethazine DM cough syrup for congestion/cough. This could make you drowsy, so take at night before bed. -Start the antibiotic-Amoxicillin, 1 pill every 12 hours for 7 days.  You can take this with food like with breakfast and dinner. -Continue over-the-counter medications for additional relief.      ED Prescriptions     Medication Sig Dispense Auth. Provider   predniSONE (DELTASONE) 20 MG tablet Take 1 tablet (20 mg total) by mouth daily for 5 days. 5 tablet , PA-C   promethazine-dextromethorphan (PROMETHAZINE-DM) 6.25-15 MG/5ML syrup Take 5 mLs by mouth 4 (four) times daily as needed for cough. 118 mL 05-19-1990, PA-C   amoxicillin (AMOXIL) 875 MG tablet Take 1 tablet (875 mg total) by mouth 2 (two) times daily for 7 days. 14 tablet Rhys Martini, PA-C      PDMP not reviewed this encounter.   Rhys Martini, PA-C 10/13/20 [phone]

## 2020-10-13 NOTE — Discharge Instructions (Addendum)
-  Prednisone, 2 pills taken at the same time for 5 days in a row.  Try taking this earlier in the day as it can give you energy. Avoid NSAIDs like ibuprofen and alleve while taking this medication as they can increase your risk of stomach upset and even GI bleeding when in combination with a steroid. You can continue tylenol (acetaminophen) up to 1000mg  3x daily. -Promethazine DM cough syrup for congestion/cough. This could make you drowsy, so take at night before bed. -Start the antibiotic-Amoxicillin, 1 pill every 12 hours for 7 days.  You can take this with food like with breakfast and dinner. -Continue over-the-counter medications for additional relief.

## 2020-10-31 ENCOUNTER — Ambulatory Visit: Payer: Self-pay | Admitting: Physician Assistant

## 2020-10-31 NOTE — Progress Notes (Deleted)
Patient ID: Alison Farmer, female   DOB: 1970-09-24, 50 y.o.   MRN: [id number]   After ED visit 10/13/2020  From ED A/P: This patient is a very pleasant 50 y.o. year old female presenting with acute bronchitis and R AOM. Afebrile, nontachy. Few expiratory wheezes but no rhonchi or rales, oxygenating well on room air.  Denies history pulmonary disease.   For bronchitis-prednisone and Promethazine DM. For AOM-amoxicillin.   She is postmenopausal

## 2021-01-09 ENCOUNTER — Encounter: Payer: Self-pay | Admitting: Physician Assistant

## 2021-01-09 ENCOUNTER — Ambulatory Visit: Payer: Self-pay | Attending: Physician Assistant | Admitting: Physician Assistant

## 2021-01-09 ENCOUNTER — Other Ambulatory Visit: Payer: Self-pay

## 2021-01-09 VITALS — BP 109/72 | HR 96 | Resp 16 | Ht 60.5 in | Wt 175.2 lb

## 2021-01-09 DIAGNOSIS — R0989 Other specified symptoms and signs involving the circulatory and respiratory systems: Secondary | ICD-10-CM

## 2021-01-09 DIAGNOSIS — R1012 Left upper quadrant pain: Secondary | ICD-10-CM

## 2021-01-09 NOTE — Progress Notes (Signed)
Patient ID: Alison Farmer, female   DOB: February 10, 1971, 50 y.o.   MRN: [id number]   Alison Farmer, is a 50 y.o. female  KVQ:[id number]  IEP:[id number]  DOB - [birth year]  Chief Complaint  Patient presents with   throat issues        Subjective:   Alison Farmer is a 50 y.o. female here today for globus sensation and mucus in her throat.  This has been going on for 3 months.  She wants to see Dr Ezzard Standing for this.   Some LUQ pain.  This is intermittent and going on for more than 1 year.  Pain comes and goes.  No N/V/D.  No change in BM.  Feels bloated.  Appetite is good.    Patient has No headache, No chest pain. No Nausea, No new weakness tingling or numbness, No Cough - SOB.  No problems updated.  ALLERGIES: Allergies  Allergen Reactions   Avocado Itching    PAST MEDICAL HISTORY: Past Medical History:  Diagnosis Date   Hyperpigmentation    of tongue   Seasonal allergies     MEDICATIONS AT HOME: Prior to Admission medications   Medication Sig Start Date End Date Taking? Authorizing Provider  promethazine-dextromethorphan (PROMETHAZINE-DM) 6.25-15 MG/5ML syrup Take 5 mLs by mouth 4 (four) times daily as needed for cough. Patient not taking: Reported on 01/09/2021 10/13/20   Rhys Martini, PA-C    ROS: Neg resp Neg cardiac Neg GU Neg MS Neg psych Neg neuro  Objective:   Vitals:   01/09/21 1454  BP: 109/72  Pulse: 96  Resp: 16  SpO2: 98%  Weight: 175 lb 3.2 oz (79.5 kg)  Height: 5' 0.5" (1.537 m)   Exam General appearance : Awake, alert, not in any distress. Speech Clear. Not toxic looking HEENT: Atraumatic and Normocephalic Neck: Supple, no JVD. No cervical lymphadenopathy. No thyromegaly Chest: Good air entry bilaterally, CTAB.  No rales/rhonchi/wheezing CVS: S1 S2 regular, no murmurs.  Abdomen: Bowel sounds present, Non tender and not distended with no gaurding, rigidity or rebound. Extremities: B/L Lower Ext  shows no edema, both legs are warm to touch Neurology: Awake alert, and oriented X 3, CN II-XII intact, Non focal Skin: No Rash  Data Review Lab Results  Component Value Date   HGBA1C 5.6 09/05/2016    Assessment & Plan   1. LUQ pain Non-acute abdomen-occurring X 1 year.  No urinary s/sx - Comprehensive metabolic panel - Lipase  2. Globus sensation - Ambulatory referral to ENT - Thyroid Panel With TSH    Patient have been counseled extensively about nutrition and exercise. Other issues discussed during this visit include: low cholesterol diet, weight control and daily exercise, foot care, annual eye examinations at Ophthalmology, importance of adherence with medications and regular follow-up. We also discussed long term complications of uncontrolled diabetes and hypertension.   Return in about 6 months (around 07/10/2021) for Please assign PCP- I am not PCP.  The patient was given clear instructions to go to ER or return to medical center if symptoms don't improve, worsen or new problems develop. The patient verbalized understanding. The patient was told to call to get lab results if they haven't heard anything in the next week.      Georgian Co, PA-C [facility] [place], [place] [phone]   01/09/2021, 3:03 PM

## 2021-01-10 LAB — CBC WITH DIFFERENTIAL/PLATELET
Basophils Absolute: 0.1 10*3/uL (ref 0.0–0.2)
Basos: 1 %
EOS (ABSOLUTE): 0.3 10*3/uL (ref 0.0–0.4)
Eos: 5 %
Hematocrit: 36.1 % (ref 34.0–46.6)
Hemoglobin: 12 g/dL (ref 11.1–15.9)
Immature Grans (Abs): 0 10*3/uL (ref 0.0–0.1)
Immature Granulocytes: 0 %
Lymphocytes Absolute: 2.7 10*3/uL (ref 0.7–3.1)
Lymphs: 38 %
MCH: 26 pg — ABNORMAL LOW (ref 26.6–33.0)
MCHC: 33.2 g/dL (ref 31.5–35.7)
MCV: 78 fL — ABNORMAL LOW (ref 79–97)
Monocytes Absolute: 0.3 10*3/uL (ref 0.1–0.9)
Monocytes: 4 %
Neutrophils Absolute: 3.7 10*3/uL (ref 1.4–7.0)
Neutrophils: 52 %
Platelets: 198 10*3/uL (ref 150–450)
RBC: 4.62 x10E6/uL (ref 3.77–5.28)
RDW: 13.1 % (ref 11.7–15.4)
WBC: 7.1 10*3/uL (ref 3.4–10.8)

## 2021-01-10 LAB — COMPREHENSIVE METABOLIC PANEL
ALT: 19 IU/L (ref 0–32)
AST: 22 IU/L (ref 0–40)
Albumin/Globulin Ratio: 1.6 (ref 1.2–2.2)
Albumin: 4.5 g/dL (ref 3.8–4.8)
Alkaline Phosphatase: 106 IU/L (ref 44–121)
BUN/Creatinine Ratio: 10 (ref 9–23)
BUN: 7 mg/dL (ref 6–24)
Bilirubin Total: 0.2 mg/dL (ref 0.0–1.2)
CO2: 22 mmol/L (ref 20–29)
Calcium: 9.2 mg/dL (ref 8.7–10.2)
Chloride: 104 mmol/L (ref 96–106)
Creatinine, Ser: 0.69 mg/dL (ref 0.57–1.00)
Globulin, Total: 2.9 g/dL (ref 1.5–4.5)
Glucose: 94 mg/dL (ref 70–99)
Potassium: 4.6 mmol/L (ref 3.5–5.2)
Sodium: 143 mmol/L (ref 134–144)
Total Protein: 7.4 g/dL (ref 6.0–8.5)
eGFR: 106 mL/min/{1.73_m2} (ref >59)

## 2021-01-10 LAB — THYROID PANEL WITH TSH
Free Thyroxine Index: 1.8 (ref 1.2–4.9)
T3 Uptake Ratio: 21 % — ABNORMAL LOW (ref 24–39)
T4, Total: 8.4 ug/dL (ref 4.5–12.0)
TSH: 3.74 u[IU]/mL (ref 0.450–4.500)

## 2021-01-10 LAB — LIPASE: Lipase: 43 U/L (ref 14–72)

## 2021-01-11 ENCOUNTER — Telehealth (INDEPENDENT_AMBULATORY_CARE_PROVIDER_SITE_OTHER): Payer: Self-pay

## 2021-01-11 NOTE — Telephone Encounter (Signed)
-----   Message from Anders Simmonds, [place] sent at 01/10/2021  1:48 PM EDT ----- Please call patient.  Her labs are normal including thyroid, blood sugar, kidney function, electrolytes, pancreatic and liver function.  Follow up as planned.  Thanks, Georgian Co, PA-C

## 2021-01-11 NOTE — Telephone Encounter (Signed)
Call placed to patient with the assistance interpreter [phone]). Per DPR left voicemail informing patient of normal labs. Return call to [facility] at [phone] with any questions or concerns. Maryjean Morn, CMA

## 2021-02-01 ENCOUNTER — Telehealth: Payer: Self-pay | Admitting: Physician Assistant

## 2021-02-01 NOTE — Telephone Encounter (Signed)
Copied from CRM [id number]. Topic: General - Inquiry >> Jan 31, 2021 10:49 AM Aretta Nip wrote: Reason for CRM: Pt states that Arna Medici had called her about a dr she had ask about and she is returning the call. Pt request cb to [phone]

## 2021-05-22 ENCOUNTER — Ambulatory Visit: Payer: Self-pay | Admitting: *Deleted

## 2021-05-22 NOTE — Telephone Encounter (Signed)
?  Chief Complaint: pain in neck and numbness in both arms , request appt  ?Symptoms: neck pain , mild headache, numbness in left arm greater than right arm. Pain worse at night. Left leg with cramps at times, at night . C/o chest pain at times not now.  Sx come and goes. Noted to be forgetful.  ?Frequency: started 2 months ago getting more often. ?Pertinent Negatives: Patient denies chest pain now, no difficulty breathing, no weakness on one side of body no visual disturbances or issues with speech. No issues with walking or balance. ?Disposition: [] ED /[] Urgent Care (no appt availability in office) / [x] Appointment(In office/virtual)/ []  [facility]/ [] Home Care/ [] Refused Recommended Disposition /[] [place]/ []  Follow-up with PCP ?Additional Notes: instructed patient if symptoms worsen go to ED now. Requesting patient seen in 3 days. No available appt until 06/13/21 and appt scheduled. Please advise.  ? ? ? ? Reason for Disposition ? [1] Numbness or tingling in one or both hands AND [2] is a chronic symptom (recurrent or ongoing AND present > 4 weeks) ? ?Answer Assessment - Initial Assessment Questions ?1. SYMPTOM: "What is the main symptom you are concerned (e.g., weakness, numbness) ?    Numbness mostly in left arm but reports noted in both arms  ?2. ONSET: "When did this start?" (minutes, hours, days; while sleeping) ?    Started 2 months ago  ?3. LAST NORMAL: "When was the last time you (the patient) were normal (no symptoms)?" ?    2 months ago  ?4. PATTERN "Does this come and go, or has it been constant since it started?"  "Is it present now?" ?    Come and goes present now but denies feeling "bad" ?5. CARDIAC SYMPTOMS: "Have you had any of the following symptoms: chest pain, difficulty breathing, palpitations?" ?    Chest pain at times not now  ?6. NEUROLOGIC SYMPTOMS: "Have you had any of the following symptoms: headache, dizziness, vision loss, double vision, changes in speech,  unsteady on your feet?" ?    Mild headache , forgetting easily  ?7. OTHER SYMPTOMS: "Do you have any other symptoms?" ?    Mild headache left arm pain and neck and back . ?8. PREGNANCY: "Is there any chance you are pregnant?" "When was your last menstrual period?" ?    na ? ?Protocols used: Neurologic Deficit-A-AH ? ?

## 2021-06-13 ENCOUNTER — Ambulatory Visit: Payer: Self-pay | Attending: Physician Assistant | Admitting: Physician Assistant

## 2021-06-13 ENCOUNTER — Encounter: Payer: Self-pay | Admitting: Physician Assistant

## 2021-06-13 ENCOUNTER — Other Ambulatory Visit: Payer: Self-pay

## 2021-06-13 VITALS — BP 119/79 | HR 88 | Wt 179.2 lb

## 2021-06-13 DIAGNOSIS — R519 Headache, unspecified: Secondary | ICD-10-CM

## 2021-06-13 DIAGNOSIS — H6983 Other specified disorders of Eustachian tube, bilateral: Secondary | ICD-10-CM

## 2021-06-13 DIAGNOSIS — R202 Paresthesia of skin: Secondary | ICD-10-CM

## 2021-06-13 DIAGNOSIS — R072 Precordial pain: Secondary | ICD-10-CM

## 2021-06-13 MED ORDER — FLUTICASONE PROPIONATE 50 MCG/ACT NA SUSP
2.0000 | Freq: Every day | NASAL | 6 refills | Status: AC
Start: 2021-06-13 — End: ?
  Filled 2021-06-13: qty 16, 30d supply, fill #0

## 2021-06-13 MED ORDER — IBUPROFEN 600 MG PO TABS
600.0000 mg | ORAL_TABLET | Freq: Three times a day (TID) | ORAL | 0 refills | Status: AC | PRN
Start: 2021-06-13 — End: ?
  Filled 2021-06-13: qty 30, 10d supply, fill #0

## 2021-06-13 NOTE — Progress Notes (Signed)
Patient ID: Alison Farmer, female   DOB: 1971/02/19, 51 y.o.   MRN: [id number] ? ? ?Alison Farmer, is a 51 y.o. female ? ?[id number] ? ?[id number] ? ?DOB - Oct 29, 1970 ? ?Chief Complaint  ?Patient presents with  ? Numbness  ?    ? ?Subjective:  ? ?Alison Farmer is a 51 y.o. female here today for HAs for about 8-9 months.  She never takes medication when she gets the HA.  No change is vision.  No thunderclap.  No aura.  Sometimes it feels like water is dripping on her scalp.  No precipitating factors.   ? ?Also c/o occasional precordial CP.  No SOB.  This has been going on about 2 years.  Occurs periodically and not regularly.  No precipitating factors other than it occurs with certain movements such as reaching or bending certain ways.  No FH cardiac dz.  She has never been a smoker.  It is alleviated if she applies pressure to her chest when it occurs.  No diaphoresis.  No radiating pain.   ? ?She has occasional paresthesias in various places in her body.  Sometimes it might be on her L abdomen, her R hand, her shin.  It is random without explanation/precipitating or alleviating factors ? ?ALLERGIES: ?Allergies  ?Allergen Reactions  ? Avocado Itching  ? ? ?PAST MEDICAL HISTORY: ?Past Medical History:  ?Diagnosis Date  ? 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 Seasonal allergies   ? ? ?MEDICATIONS AT HOME: ?Prior to Admission medications   ?Medication Sig Start Date End Date Taking? Authorizing Provider  ?fluticasone (FLONASE) 50 MCG/ACT nasal spray Place 2 sprays into both nostrils daily. 06/13/21  Yes Anders Simmonds, PA-C  ?ibuprofen (ADVIL) 600 MG tablet Take 1 tablet (600 mg total) by mouth every 8 (eight) hours as needed. 06/13/21  Yes Anders Simmonds, PA-C  ?promethazine-dextromethorphan (PROMETHAZINE-DM) 6.25-15 MG/5ML syrup Take 5 mLs by mouth 4 (four) times daily as needed for cough. ?Patient not taking: Reported on 01/09/2021 10/13/20   Rhys Martini, PA-C   ? ? ?ROS: ?Neg HEENT ?Neg resp ?Neg GI ?Neg GU ?Neg MS ?Neg psych ? ?Objective:  ? ?Vitals:  ? 06/13/21 1553  ?BP: 119/79  ?Pulse: 88  ?SpO2: 97%  ?Weight: 179 lb 3.2 oz (81.3 kg)  ? ?Exam ?General appearance : Awake, alert, not in any distress. Speech Clear. Not toxic looking ?HEENT: Atraumatic and Normocephalic.  Eom intact.  Fundi benign.  PERRLA.  TM B full but no infection.  No cerumen B.   ?Neck: Supple, no JVD. No cervical lymphadenopathy. No bruit. ?Chest: Good air entry bilaterally, CTAB.  No rales/rhonchi/wheezing ?CVS: S1 S2 regular, no murmurs.  ?Extremities: B/L Lower Ext shows no edema, both legs are warm to touch ?Neurology: Awake alert, and oriented X 3, CN II-XII intact, Non focal.  Finger to nose, heel to shin normal B. ?Skin: No Rash ? ?Data Review ?Lab Results  ?Component Value Date  ? HGBA1C 5.6 09/05/2016  ? ? ?Assessment & Plan  ? ?1. Nonintractable headache, unspecified chronicity pattern, unspecified headache type ?No red flags ?- ibuprofen (ADVIL) 600 MG tablet; Take 1 tablet (600 mg total) by mouth every 8 (eight) hours as needed.  Dispense: 30 tablet; Refill: 0 ?- CBC with Differential/Platelet ?- Comprehensive metabolic panel ? ?2. Paresthesias ?- Thyroid Panel With TSH ?- Vitamin D, 25-hydroxy ? ?3. Precordial pain ?Not new-no red flags.  VSS.  Few risk factors ?- CBC with Differential/Platelet ?-  Comprehensive metabolic panel ? ?4. Dysfunction of both eustachian tubes ?Could be contributing to HA. ?- fluticasone (FLONASE) 50 MCG/ACT nasal spray; Place 2 sprays into both nostrils daily.  Dispense: 16 g; Refill: 6 ? ? ? ?Patient have been counseled extensively about nutrition and exercise. Other issues discussed during this visit include: low cholesterol diet, weight control and daily exercise, foot care, annual eye examinations at Ophthalmology, importance of adherence with medications and regular follow-up. We also discussed long term complications of uncontrolled diabetes and  hypertension.  ? ?Return in about 3 months (around 09/13/2021) for with PCP;  sooner if needed. ? ?The patient was given clear instructions to go to ER or return to medical center if symptoms don't improve, worsen or new problems develop. The patient verbalized understanding. The patient was told to call to get lab results if they haven't heard anything in the next week.  ? ? ? ? ?Georgian Co, PA-C ?[facility] ?[place], [place] ?[phone]   ?06/13/2021, 4:26 PM  ?

## 2021-06-13 NOTE — Patient Instructions (Signed)
Cefalea tensional en los adultos ?Tension Headache, Adult ?La cefalea tensional es una sensaci?n de dolor o presi?n en la cabeza. Suele sentirse Intelsobre la frente y a los lados de la cabeza. Las cefaleas tensionales pueden durar de 30 minutos a varios d?as. ??Cu?les son las causas? ?Se desconoce la causa de esta afecci?n. A veces, las cefaleas tensionales se producen por el estr?s, la preocupaci?n (ansiedad) o la depresi?n. Otras cosas que pueden ser causarlas son las siguientes: ?Alcohol. ?Exceso de cafe?na o abstinencia de cafe?na. ?Resfr?os, gripe o infecciones de los senos paranasales. ?Problemas dentales. Estos pueden Arts administratorincluir apretar los dientes. ?Cansancio. ?Mantener la cabeza y el cuello en la misma posici?n durante un per?odo prolongado, por ejemplo, al Whole Foodsusar la computadora. ?Fumar. ?Artritis en el cuello. ??Cu?les son los signos o los s?ntomas? ?Sensaci?n de presi?n alrededor de la cabeza. ?Un dolor sordo en la cabeza. ?Dolor sobre la frente y los lados de la cabeza. ?Dolor a Leisure centre managerla palpaci?n en los m?sculos de la cabeza, del cuello y de los hombros. ??C?mo se trata? ?Esta afecci?n puede tratarse con cambios en el estilo de vida y medicamentos que ayudan a Paramedicaliviar los s?ntomas. ?Siga estas instrucciones en su casa: ?Control del dolor ?Use los medicamentos de venta libre y los recetados solamente como se lo haya indicado el m?dico. ?Cuando tenga una cefalea, acu?stese en una habitaci?n oscura y silenciosa. ?Si se lo indican, p?ngase hielo en la cabeza y el cuello. Para hacer esto: ?Ponga el hielo en una bolsa pl?stica. ?Coloque una FirstEnergy Corptoalla entre la piel y [place] bolsa. ?Aplique el hielo durante 20 minutos, 2 o 3 veces por d?a. ?Retire el hielo si la piel se le pone de color rojo brillante. Esto es Intelmuy importante. Si no puede sentir dolor, calor o fr?o, tiene un mayor riesgo de que se da?e la zona. ?Si se lo indican, apl?quese calor en la nuca. H?galo con la frecuencia que le haya indicado el m?dico. Use la fuente de  calor que el m?dico le recomiende, como una compresa de calor h?medo o una almohadilla t?rmica. ?Coloque una FirstEnergy Corptoalla entre la piel y la fuente de Airline pilotcalor. ?Aplique calor durante 20 a 30 minutos. ?Retire la fuente de calor si la piel se le pone de color rojo brillante. Esto es Intelmuy importante. Si no puede sentir dolor, calor o fr?o, tiene un mayor riesgo de Val Verdequemarse. ?Comida y bebida ?Mantenga un horario para las comidas. ?Si bebe alcohol: ?Limite la cantidad que bebe a lo siguiente: ?De 0 a 1 medida por d?a para las mujeres que no est?n embarazadas. ?De 0 a 2 medidas por d?a para los hombres. ?Sepa cu?nta cantidad de alcohol hay en las bebidas que toma. En los [address] [place], una medida equivale a una botella de cerveza de 12 oz (355 ml), un vaso de vino de 5 oz (148 ml) o un vaso de una bebida alcoh?lica de alta graduaci?n de 1? oz (44 ml). ?Beba suficiente l?quido para mantener el pis (orina) de color amarillo p?lido. ?No consuma mucha cafe?na ni deje de consumirla por completo. ?Estilo de vida ?Duerma entre 7 y 9 horas todas las noches. O duerma la cantidad de Sempra Energytiempo que le indique el m?dico. ?A la hora de Ledgewoodacostarse, Burnhammantenga las computadoras, los tel?fonos y las tabletas fuera del dormitorio. ?Encuentre maneras de reducir el estr?s. Esto puede incluir: ?Education officer, environmentalealizar actividad f?sica. ?Respiraci?n profunda. ?Yoga. ?Escuchar m?sica. ?Tener pensamientos positivos. ?Si?ntese con la espalda recta. Intentar relajar los m?sculos. ?No fume ni consuma ning?n producto que contenga nicotina o  tabaco. Si necesita ayuda para dejar de consumir estos productos, consulte al m?dico. ?Instrucciones generales ? ?Evite las cosas que puedan provocar las cefaleas. Lleve un diario sobre las cefaleas para ver qu? puede provocarlas. Registre, por ejemplo, lo siguiente: ?Lo que usted come y bebe. ?El tiempo que duerme. ?Alg?n cambio en su dieta o en los medicamentos. ?Cumpla con todas las visitas de seguimiento. ?Comun?quese con un m?dico  si: ?La cefalea no se alivia. ?La cefalea regresa. ?Tiene una cefalea y [employer] sonidos, la luz o los olores. ?Tiene ganas de vomitar o vomita. ?Le duele el est?mago. ?Solicite ayuda de inmediato si: ?De repente siente un dolor de cabeza muy intenso junto con cualquiera de estas cosas: ?Rigidez en el cuello. ?Ganas de vomitar. ?V?mitos. ?Sentirse confundido (confuso). ?Sensaci?n de debilidad en una parte o un lado del cuerpo. ?Problemas para ver o hablar, o ambas cosas. ?Falta de aire. ?Erupci?n cut?nea. ?Sentirse muy somnoliento. ?Dolor en el ojo o el o?do. ?Dificultad para caminar o mantener el equilibrio. ?Sensaci?n de que va a desmayarse, o se desmaya. ?Resumen ?La cefalea tensional es un dolor o una presi?n que se siente en la cabeza. ?Las cefaleas tensionales pueden durar de 30 minutos a varios d?as. ?Los [employer] en el estilo de vida y los medicamentos pueden ayudar a Engineer, materials. ?Esta informaci?n no tiene Theme park manager el consejo del m?dico. Aseg?rese de hacerle al m?dico cualquier pregunta que tenga. ?Document Revised: 01/10/2020 Document Reviewed: 01/10/2020 ?Elsevier Patient Education ? 2022 Elsevier Inc. ?Disfunci?n de la trompa de [place] ?Eustachian Tube Dysfunction ?Disfunci?n de la trompa de [place] hace referencia a una afecci?n en la que se forma una obstrucci?n en el pasaje estrecho que conecta el o?do medio con la parte posterior de la nariz (trompa de [place]). La trompa de E[employer] regula la presi?n del aire en el o?do medio al permitir que el aire se mueva entre el o?do y la [place]. Tambi?n ayuda a drenar el l?quido del espacio del o?do medio. ?La disfunci?n de la trompa de [place] puede afectar a uno o ambos o?dos. Cuando la trompa de [place] no funciona correctamente, la presi?n de aire, de l?quido, o ambas pueden acumularse en el o?do medio. ??Cu?les son las causas? ?Esta afecci?n se produce cuando la trompa de [place] se obstruye o no puede abrirse  normalmente. Las causas m?s frecuentes de esta afecci?n incluyen las siguientes: ?Infecciones en los o?dos. ?Resfriados y otras infecciones que afectan la nariz, la boca y la garganta (v?as respiratorias superiores). ?Alergias. ?Irritaci?n por el humo del cigarrillo. ?Irritaci?n por el retroceso de ?cido estomacal hacia el es?fago (reflujo gastroesof?gico). El es?fago es el ?rgano del cuerpo que transporta los alimentos desde la boca al est?mago. ?Cambios s?bitos en la presi?n del aire, como cuando baja el avi?n o cuando se practica buceo. ?Crecimientos anormales en la nariz o la garganta, por ejemplo: ?Crecimientos en el interior de la nariz (p?lipos nasales). ?Crecimiento anormal de c?lulas (tumores). ?Agrandamiento de tejido en la parte posterior de la garganta (adenoides). ??Qu? incrementa el riesgo? ?Es m?s probable que desarrolle esta afecci?n si: ?Fuma. ?Tiene sobrepeso. ?Es un ni?o que tiene: ?Ciertos defectos cong?nitos de la boca, como fisura Therapist, music. ?Am?gdalas o adenoides grandes. ??Cu?les son los signos o s?ntomas? ?Los s?ntomas frecuentes de esta afecci?n incluyen los siguientes: ?Sensaci?n de que el o?do est? lleno. ?Dolor de o?do. ?Ruidos de chasquidos o Om[employer] o?do. ?Zumbido en el o?do (ac?fenos). ?P?rdida auditiva. ?P?rdida del equilibrio. ?Mareos. ?Los s?ntomas pueden empeorar cuando la  presi?n del aire que lo rodea cambia, como cuando viaja a una zona de gran elevaci?n, vuela en avi?n o practica buceo. ??C?mo se diagnostica? ?Esta afecci?n se puede diagnosticar en funci?n de lo siguiente: ?Sus s?ntomas. ?Un examen f?sico de los o?dos, la nariz y Administrator. ?Pruebas, como por ejemplo aquellos que miden: ?El movimiento del t?mpano. ?Su audici?n (audiometr?a). ??C?mo se trata? ?El tratamiento depende de la causa y de la gravedad de la afecci?n. ?En los casos leves, es posible aliviar los s?ntomas con el movimiento de aire [place] o?dos. Esto se denomina ?destaparse los o?dos?Marland Kitchen ?En los  casos m?s graves, o si tiene s?ntomas de l?quido [employer], el tratamiento puede incluir: ?Medicamentos para Technical sales engineer congesti?n (descongestivos). ?Medicamentos para tratar alergias (antihistam?nicos). ?Aerosoles

## 2021-06-14 LAB — THYROID PANEL WITH TSH
Free Thyroxine Index: 1.7 (ref 1.2–4.9)
T3 Uptake Ratio: 21 % — ABNORMAL LOW (ref 24–39)
T4, Total: 7.9 ug/dL (ref 4.5–12.0)
TSH: 3.32 u[IU]/mL (ref 0.450–4.500)

## 2021-06-14 LAB — CBC WITH DIFFERENTIAL/PLATELET
Basophils Absolute: 0.1 10*3/uL (ref 0.0–0.2)
Basos: 1 %
EOS (ABSOLUTE): 0.3 10*3/uL (ref 0.0–0.4)
Eos: 5 %
Hematocrit: 35.5 % (ref 34.0–46.6)
Hemoglobin: 12.4 g/dL (ref 11.1–15.9)
Immature Grans (Abs): 0 10*3/uL (ref 0.0–0.1)
Immature Granulocytes: 0 %
Lymphocytes Absolute: 2.9 10*3/uL (ref 0.7–3.1)
Lymphs: 43 %
MCH: 27 pg (ref 26.6–33.0)
MCHC: 34.9 g/dL (ref 31.5–35.7)
MCV: 77 fL — ABNORMAL LOW (ref 79–97)
Monocytes Absolute: 0.3 10*3/uL (ref 0.1–0.9)
Monocytes: 5 %
Neutrophils Absolute: 3.1 10*3/uL (ref 1.4–7.0)
Neutrophils: 46 %
Platelets: 204 10*3/uL (ref 150–450)
RBC: 4.59 x10E6/uL (ref 3.77–5.28)
RDW: 13.3 % (ref 11.7–15.4)
WBC: 6.6 10*3/uL (ref 3.4–10.8)

## 2021-06-14 LAB — COMPREHENSIVE METABOLIC PANEL
ALT: 18 IU/L (ref 0–32)
AST: 22 IU/L (ref 0–40)
Albumin/Globulin Ratio: 1.6 (ref 1.2–2.2)
Albumin: 4.7 g/dL (ref 3.8–4.8)
Alkaline Phosphatase: 101 IU/L (ref 44–121)
BUN/Creatinine Ratio: 11 (ref 9–23)
BUN: 7 mg/dL (ref 6–24)
Bilirubin Total: 0.2 mg/dL (ref 0.0–1.2)
CO2: 28 mmol/L (ref 20–29)
Calcium: 9.7 mg/dL (ref 8.7–10.2)
Chloride: 106 mmol/L (ref 96–106)
Creatinine, Ser: 0.66 mg/dL (ref 0.57–1.00)
Globulin, Total: 3 g/dL (ref 1.5–4.5)
Glucose: 98 mg/dL (ref 70–99)
Potassium: 4.3 mmol/L (ref 3.5–5.2)
Sodium: 146 mmol/L — ABNORMAL HIGH (ref 134–144)
Total Protein: 7.7 g/dL (ref 6.0–8.5)
eGFR: 107 mL/min/{1.73_m2} (ref >59)

## 2021-06-14 LAB — VITAMIN D 25 HYDROXY (VIT D DEFICIENCY, FRACTURES): Vit D, 25-Hydroxy: 32.1 ng/mL (ref 30.0–100.0)

## 2021-07-10 ENCOUNTER — Ambulatory Visit: Payer: Self-pay | Attending: Family Medicine | Admitting: Family Medicine

## 2021-07-10 ENCOUNTER — Encounter: Payer: Self-pay | Admitting: Family Medicine

## 2021-07-10 VITALS — BP 115/74 | HR 93 | Ht 60.0 in | Wt 181.2 lb

## 2021-07-10 DIAGNOSIS — R413 Other amnesia: Secondary | ICD-10-CM

## 2021-07-10 DIAGNOSIS — H9313 Tinnitus, bilateral: Secondary | ICD-10-CM

## 2021-07-10 NOTE — Patient Instructions (Signed)
Tinnitus ?Tinnitus refers to hearing a sound when there is no actual source for that sound. This is often described as ringing in the ears. However, people with this condition may hear a variety of noises, in one ear or in both ears. ?The sounds of tinnitus can be soft, loud, or somewhere in between. Tinnitus can last for a few seconds or can be constant for days. It may go away without treatment and come back at various times. When tinnitus is constant or happens often, it can lead to other problems, such as trouble sleeping and trouble concentrating. ?Almost everyone experiences tinnitus at some point. Tinnitus is not the same as hearing loss. Tinnitus that is long-lasting (chronic) or comes back often (recurs) may require medical attention. ?What are the causes? ?The cause of tinnitus is often not known. In some cases, it can result from: ?Exposure to loud noises from machinery, music, or other sources. ?An object (foreign body) stuck in the ear. ?Earwax buildup. ?Drinking alcohol or caffeine. ?Taking certain medicines. ?Age-related hearing loss. ?It may also be caused by medical conditions such as: ?Ear or sinus infections. ?Heart diseases or high blood pressure. ?Allergies. ?M?ni?re's disease. ?Thyroid problems. ?Tumors. ?A weak, bulging blood vessel (aneurysm) near the ear. ?What increases the risk? ?The following factors may make you more likely to develop this condition: ?Exposure to loud noises. ?Age. Tinnitus is more likely in older individuals. ?Using alcohol or tobacco. ?What are the signs or symptoms? ?The main symptom of tinnitus is hearing a sound when there is no source for that sound. It may sound like: ?Buzzing. ?Sizzling. ?Ringing. ?Blowing air. ?Hissing. ?Whistling. ?Other sounds may include: ?Roaring. ?Running water. ?A musical note. ?Tapping. ?Humming. ?Symptoms may affect only one ear (unilateral) or both ears (bilateral). ?How is this diagnosed? ?Tinnitus is diagnosed based on your symptoms,  your medical history, and a physical exam. Your health care provider may do a thorough hearing test (audiologic exam) if your tinnitus: ?Is unilateral. ?Causes hearing difficulties. ?Lasts 6 months or longer. ?You may work with a health care provider who specializes in hearing disorders (audiologist). You may be asked questions about your symptoms and how they affect your daily life. You may have other tests done, such as: ?CT scan. ?MRI. ?An imaging test of how blood flows through your blood vessels (angiogram). ?How is this treated? ?Treating an underlying medical condition can sometimes make tinnitus go away. If your tinnitus continues, other treatments may include: ?Therapy and counseling to help you manage the stress of living with tinnitus. ?Sound generators to mask the tinnitus. These include: ?Tabletop sound machines that play relaxing sounds to help you fall asleep. ?Wearable devices that fit in your ear and play sounds or music. ?Acoustic neural stimulation. This involves using headphones to listen to music that contains an auditory signal. Over time, listening to this signal may change some pathways in your brain and make you less sensitive to tinnitus. This treatment is used for very severe cases when no other treatment is working. ?Using hearing aids or cochlear implants if your tinnitus is related to hearing loss. Hearing aids are worn in the outer ear. Cochlear implants are surgically placed in the inner ear. ?Follow these instructions at home: ?Managing symptoms ? ?  ? ?When possible, avoid being in loud places and being exposed to loud sounds. ?Wear hearing protection, such as earplugs, when you are exposed to loud noises. ?Use a white noise machine, a humidifier, or other devices to mask the sound of tinnitus. ?  Practice techniques for reducing stress, such as meditation, yoga, or deep breathing. Work with your health care provider if you need help with managing stress. ?Sleep with your head  slightly raised. This may reduce the impact of tinnitus. ?General instructions ?Do not use stimulants, such as nicotine, alcohol, or caffeine. Talk with your health care provider about other stimulants to avoid. Stimulants are substances that can make you feel alert and attentive by increasing certain activities in the body (such as heart rate and blood pressure). These substances may make tinnitus worse. ?Take over-the-counter and prescription medicines only as told by your health care provider. ?Try to get plenty of sleep each night. ?Keep all follow-up visits. This is important. ?Contact a health care provider if: ?Your tinnitus continues for 3 weeks or longer without stopping. ?You develop sudden hearing loss. ?Your symptoms get worse or do not get better with home care. ?You feel you are not able to manage the stress of living with tinnitus. ?Get help right away if: ?You develop tinnitus after a head injury. ?You have tinnitus along with any of the following: ?Dizziness. ?Nausea and vomiting. ?Loss of balance. ?Sudden, severe headache. ?Vision changes. ?Facial weakness or weakness of arms or legs. ?These symptoms may represent a serious problem that is an emergency. Do not wait to see if the symptoms will go away. Get medical help right away. Call your local emergency services (911 in the [place] Do not drive yourself to the hospital. ?Summary ?Tinnitus refers to hearing a sound when there is no actual source for that sound. This is often described as ringing in the ears. ?Symptoms may affect only one ear (unilateral) or both ears (bilateral). ?Use a white noise machine, a humidifier, or other devices to mask the sound of tinnitus. ?Do not use stimulants, such as nicotine, alcohol, or caffeine. These substances may make tinnitus worse. ?This information is not intended to replace advice given to you by your health care provider. Make sure you discuss any questions you have with your health care  provider. ?Document Revised: 02/06/2020 Document Reviewed: 02/06/2020 ?Elsevier Patient Education ? 2023 Elsevier Inc. ? ?
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
## 2021-08-29 ENCOUNTER — Telehealth: Payer: Self-pay | Admitting: Physician Assistant

## 2021-08-29 DIAGNOSIS — Z1231 Encounter for screening mammogram for malignant neoplasm of breast: Secondary | ICD-10-CM

## 2021-08-29 NOTE — Telephone Encounter (Signed)
Copied from CRM [id number]. Topic: Referral - Status >> Aug 29, 2021 10:58 AM Macon Large wrote: Reason for CRM: Pt stated she has not heard from anyone regarding the referrals for a mammogram or ENT.

## 2021-08-30 NOTE — Telephone Encounter (Signed)
Referral for mammogram has been placed. It looks like she was referred to ENT and notes are in the chart from 12/2020

## 2021-09-02 ENCOUNTER — Other Ambulatory Visit: Payer: Self-pay | Admitting: *Deleted

## 2021-09-02 DIAGNOSIS — Z1231 Encounter for screening mammogram for malignant neoplasm of breast: Secondary | ICD-10-CM

## 2021-10-14 ENCOUNTER — Other Ambulatory Visit: Payer: Self-pay | Admitting: Otolaryngology

## 2021-10-14 ENCOUNTER — Other Ambulatory Visit (HOSPITAL_COMMUNITY): Payer: Self-pay | Admitting: Otolaryngology

## 2021-10-14 DIAGNOSIS — R1310 Dysphagia, unspecified: Secondary | ICD-10-CM

## 2021-10-17 ENCOUNTER — Ambulatory Visit
Admission: RE | Admit: 2021-10-17 | Discharge: 2021-10-17 | Disposition: A | Discharge status: Home / Self Care | Payer: Self-pay | Source: Ambulatory Visit | Attending: Obstetrics and Gynecology | Admitting: Obstetrics and Gynecology

## 2021-10-17 ENCOUNTER — Ambulatory Visit: Payer: Self-pay | Admitting: *Deleted

## 2021-10-17 VITALS — BP 110/84 | Wt 180.2 lb

## 2021-10-17 DIAGNOSIS — Z1231 Encounter for screening mammogram for malignant neoplasm of breast: Secondary | ICD-10-CM

## 2021-10-17 NOTE — Progress Notes (Signed)
Alison Farmer is a 51 y.o. female who presents to [facility] clinic today with complaint of left lower breast pain that happened twice two months ago that resolved. Patient stated the pain was a 3 out of 10. Patient stated she has a history of a left breast lump that has resolved..    Pap Smear: Pap smear not completed today. Last Pap smear was 02/09/2019 at [facility] and was normal with negative HPV. Per patient has no history of an abnormal Pap smear. Last Pap smear result is available in Epic.   Physical exam: Breasts Breasts symmetrical. No skin abnormalities bilateral breasts. No nipple retraction bilateral breasts. No nipple discharge bilateral breasts. No lymphadenopathy. No lumps palpated bilateral breasts. No complaints of pain or tenderness on exam.    Pelvic/Bimanual Pap is not indicated today per BCCCP guidelines.   Smoking History: Patient has never smoked.   Patient Navigation: Patient education provided. Access to services provided for patient through [employer] program. Spanish interpreter Alison Farmer from [facility] provided.   Colorectal Cancer Screening: Per patient has never had colonoscopy completed. FIT Test given to patient to complete. No complaints today.    Breast and Cervical Cancer Risk Assessment: Patient does not have family history of breast cancer, known genetic mutations, or radiation treatment to the chest before age 75. Patient does not have history of cervical dysplasia, immunocompromised, or DES exposure in-utero.  Risk Assessment     Risk Scores       10/17/2021   Last edited by: Meryl Dare, CMA   5-year risk: 1 %   Lifetime risk: 8.6 %            A: BCCCP exam without pap smear Complaint of left breast pain two times.  P: Referred patient to the [facility] for a screening mammogram on mobile unit. Appointment scheduled Thursday, October 17, 2021 at 1400.  Alison Heidelberg, RN 10/17/2021 1:36 PM

## 2021-10-17 NOTE — Patient Instructions (Signed)
Explained breast self awareness with [name]. Patient did not need a Pap smear today due to last Pap smear and HPV typing was 02/09/2019. Let her know BCCCP will cover Pap smears and HPV typing every 5 years unless has a history of abnormal Pap smears. Referred patient to the [facility] for a screening mammogram on mobile unit. Appointment scheduled Thursday, October 17, 2021 at 1400. Patient aware of appointment and will be there. Let patient know the Breast Center will follow up with her within the next couple weeks with results of her mammogram by letter or phone. Alison Farmer verbalized understanding.  Jhordan Mckibben, Kathaleen Maser, RN 1:36 PM

## 2021-10-23 ENCOUNTER — Ambulatory Visit (HOSPITAL_COMMUNITY)
Admission: RE | Admit: 2021-10-23 | Discharge: 2021-10-23 | Disposition: A | Discharge status: Home / Self Care | Payer: No Typology Code available for payment source | Source: Ambulatory Visit | Attending: Otolaryngology | Admitting: Otolaryngology

## 2021-10-23 DIAGNOSIS — R1310 Dysphagia, unspecified: Secondary | ICD-10-CM | POA: Insufficient documentation

## 2022-04-16 ENCOUNTER — Ambulatory Visit: Payer: Self-pay

## 2022-04-16 NOTE — Telephone Encounter (Addendum)
   Chief Complaint: Heart beating fast, headache.Sometimes "one eye is bigger than the there one." None today. Asking to be worked in. Symptoms: Above Frequency: 2 weeks ago Pertinent Negatives: Patient denies no pain today Disposition: [] ED /[] Urgent Care (no appt availability in office) / [] Appointment(In office/virtual)/ []  [place] [facility]/ [] Home Care/ [] Refused Recommended Disposition /[] [place] [facility]/ [x]  Follow-up with PCP Additional Notes: Will go to ED if symptoms return.   Answer Assessment - Initial Assessment Questions 1. DESCRIPTION: "Please describe your heart rate or heartbeat that you are having" (e.g., fast/slow, regular/irregular, skipped or extra beats, "palpitations")     Fast heart 2. ONSET: "When did it start?" (Minutes, hours or days)      2 weeks ago 3. DURATION: "How long does it last" (e.g., seconds, minutes, hours)     Minutes 4. PATTERN "Does it come and go, or has it been constant since it started?"  "Does it get worse with exertion?"   "Are you feeling it now?"     Only at night 5. TAP: "Using your hand, can you tap out what you are feeling on a chair or table in front of you, so that I can hear?" (Note: not all patients can do this)       No 6. HEART RATE: "Can you tell me your heart rate?" "How many beats in 15 seconds?"  (Note: not all patients can do this)       No 7. RECURRENT SYMPTOM: "Have you ever had this before?" If Yes, ask: "When was the last time?" and "What happened that time?"      No 8. CAUSE: "What do you think is causing the palpitations?"     Unsure 9. CARDIAC HISTORY: "Do you have any history of heart disease?" (e.g., heart attack, angina, bypass surgery, angioplasty, arrhythmia)      No 10. OTHER SYMPTOMS: "Do you have any other symptoms?" (e.g., dizziness, chest pain, sweating, difficulty breathing)       No 11. PREGNANCY: "Is there any chance you are pregnant?" "When was your last menstrual period?"        No  Protocols used: Heart Rate and Heartbeat Questions-A-AH

## 2022-04-30 IMAGING — US US PELVIS COMPLETE WITH TRANSVAGINAL
1 series · 14 of 25 positions shown · non-contrast
Comparison: 04/30/2009

CLINICAL DATA: LEFT abdominal and pelvic pain; uncertain LMP

EXAM:
TRANSABDOMINAL AND TRANSVAGINAL ULTRASOUND OF PELVIS
TECHNIQUE: Both transabdominal and transvaginal ultrasound examinations of the
pelvis were performed. Transabdominal technique was performed for
global imaging of the pelvis including uterus, ovaries, adnexal
regions, and pelvic cul-de-sac. It was necessary to proceed with
endovaginal exam following the transabdominal exam to visualize the
endometrium and ovaries.

[Series 1: us pelvic complete with transvaginal · 14 of 126 slices shown]
[im 1/126]
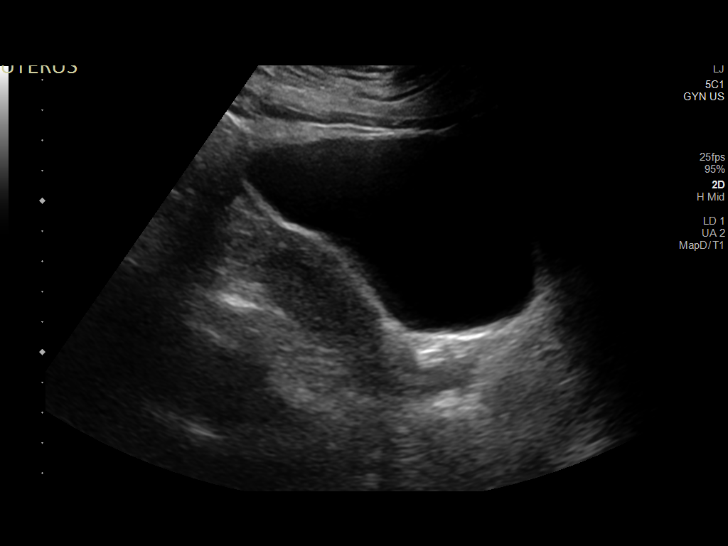
[im 11/126]
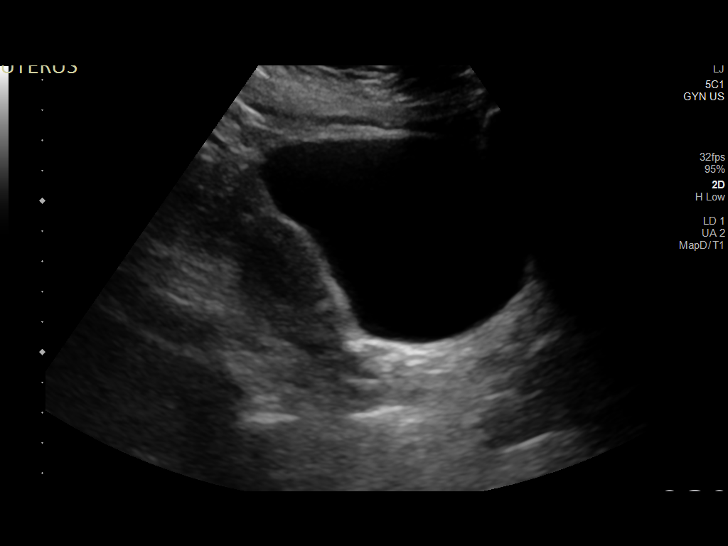
[im 21/126]
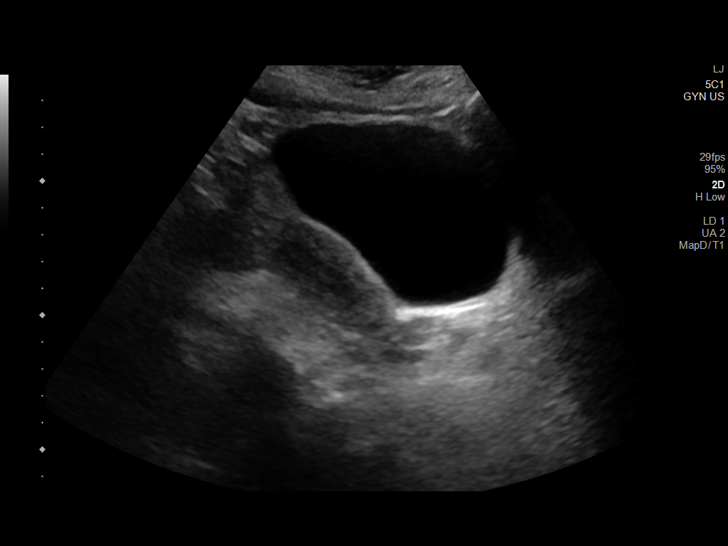
[im 32/126]
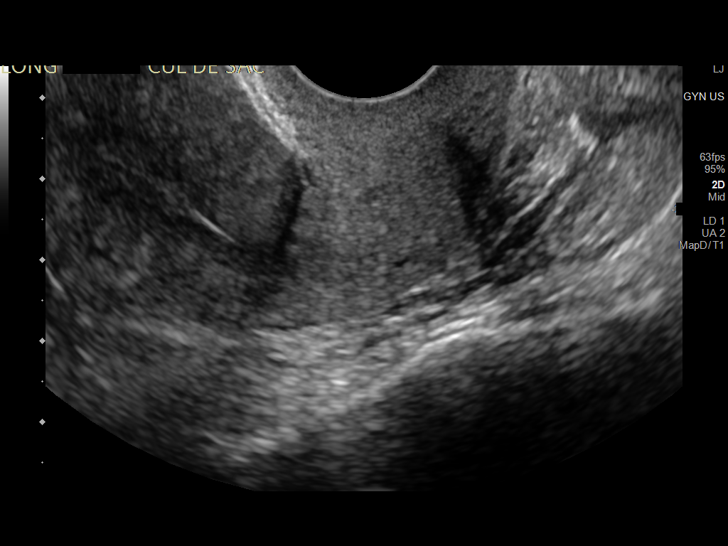
[im 42/126]
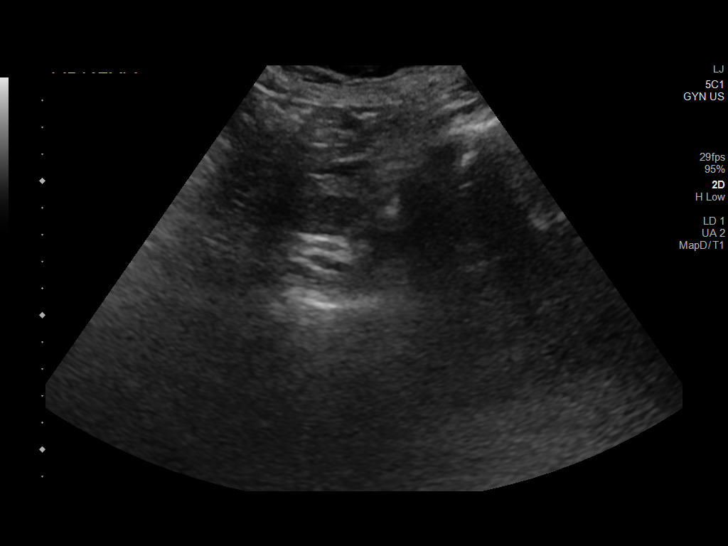
[im 47/126]
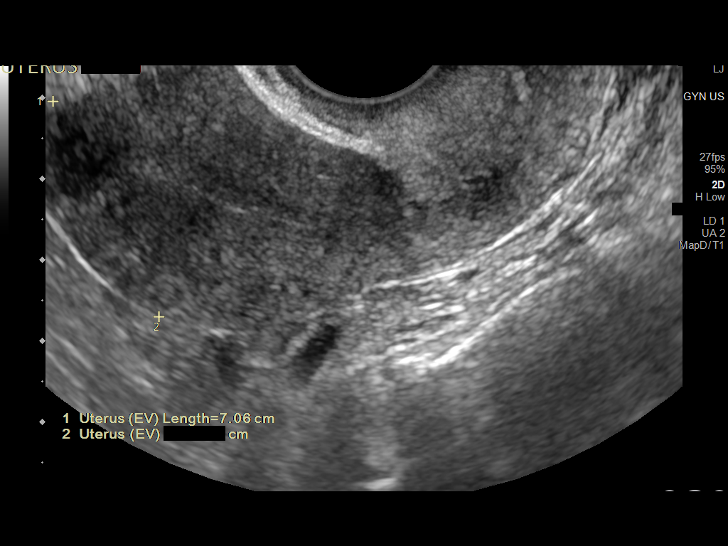
[im 58/126]
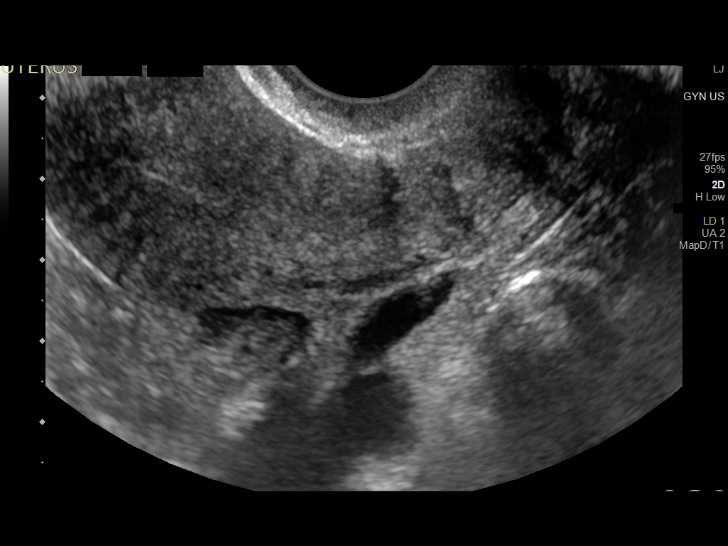
[im 68/126]
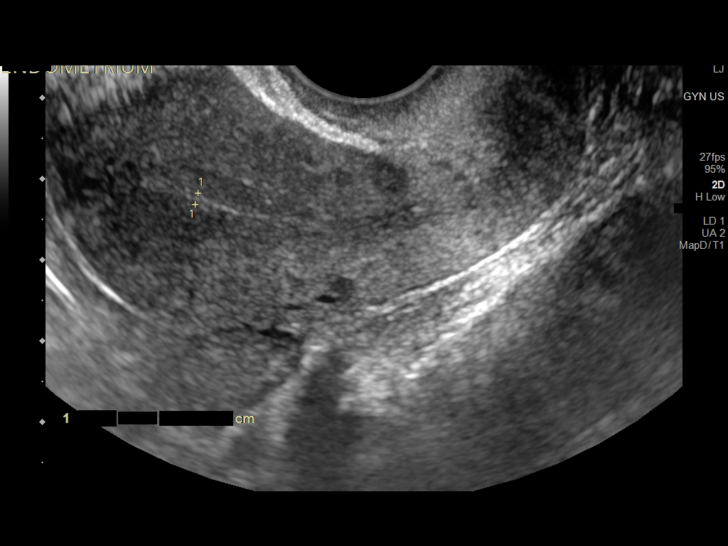
[im 79/126]
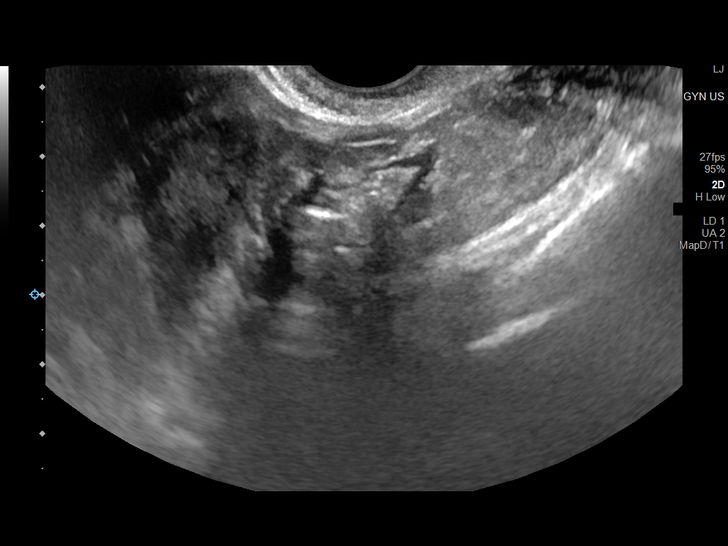
[im 84/126]
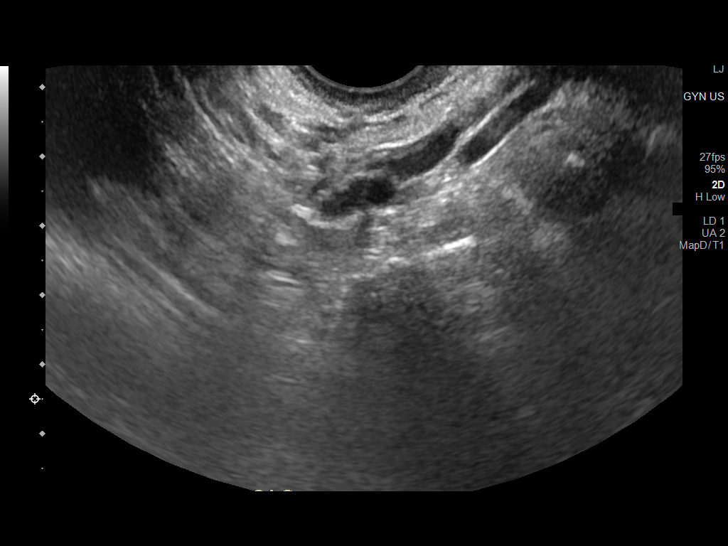
[im 94/126]
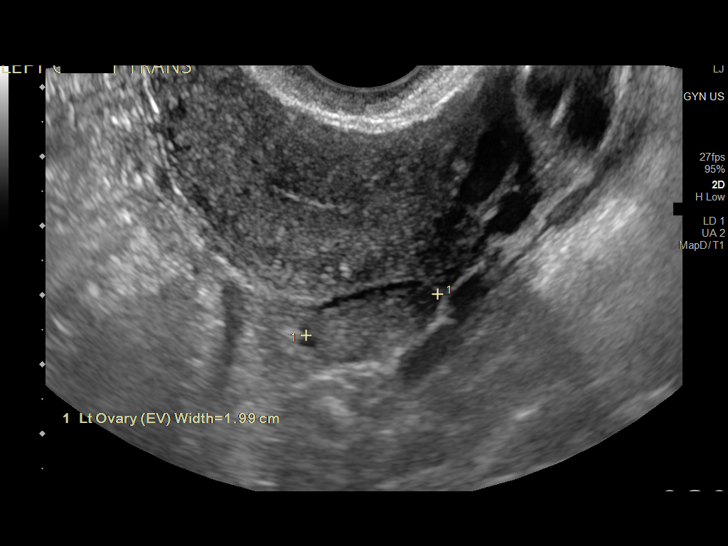
[im 105/126]
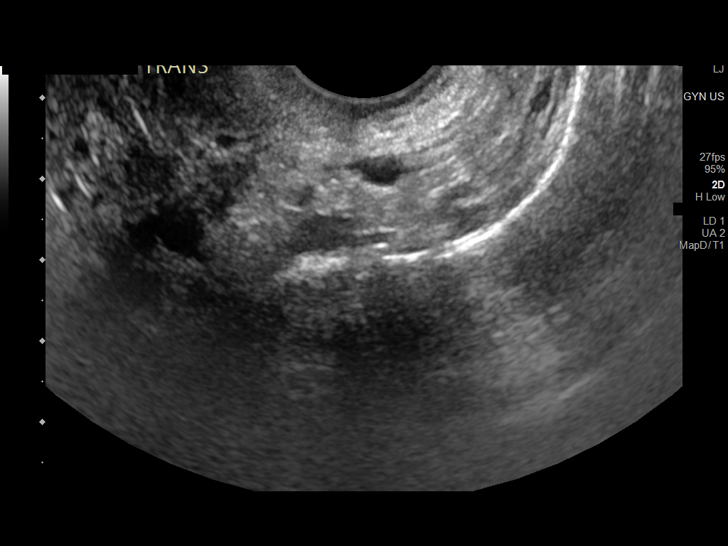
[im 115/126]
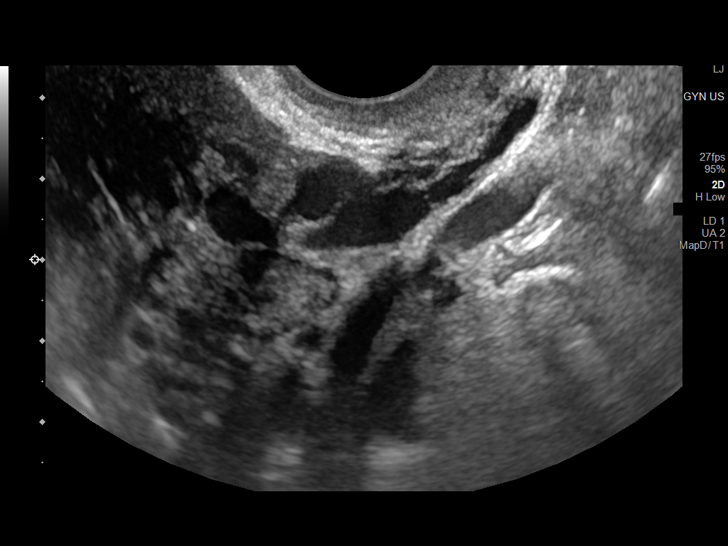
[im 126/126]
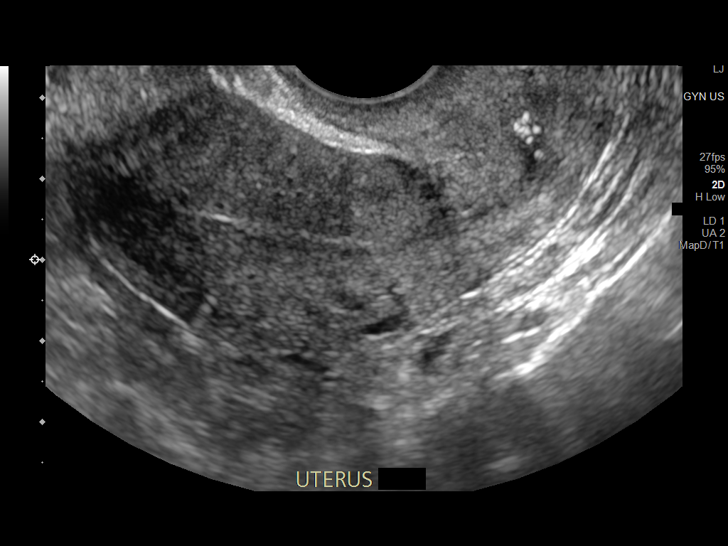

[14 of 25 positions shown; findings below may reference images not displayed]

FINDINGS: Uterus

Measurements: 7.7 x 3.3 x 3.7 cm = volume: 45 mL. Anteverted. Normal
morphology without mass

Endometrium

Thickness: 1 mm. No endometrial fluid or focal abnormality small
echogenic foci within cervix with some shadowing, could represent
tiny calcifications or minimal endocervical air.

Right ovary

Measurements: 2.7 x 1.5 x 1.2 cm = volume: 1.9 mL. Normal morphology
without mass

Left ovary

Measurements: 2.5 x 1.2 x 1.6 cm = volume: 2.6 mL. Normal morphology
without mass

Other findings

No free pelvic fluid.  No adnexal masses.
IMPRESSION: No significant pelvic sonographic abnormalities identified.

## 2022-09-04 ENCOUNTER — Other Ambulatory Visit: Payer: Self-pay | Admitting: Orthopedic Surgery

## 2022-09-04 DIAGNOSIS — M545 Low back pain, unspecified: Secondary | ICD-10-CM

## 2022-09-09 ENCOUNTER — Ambulatory Visit
Admission: RE | Admit: 2022-09-09 | Discharge: 2022-09-09 | Disposition: A | Discharge status: Home / Self Care | Payer: No Typology Code available for payment source | Source: Ambulatory Visit | Attending: Orthopedic Surgery | Admitting: Orthopedic Surgery

## 2022-09-09 DIAGNOSIS — M545 Low back pain, unspecified: Secondary | ICD-10-CM

## 2022-10-01 ENCOUNTER — Other Ambulatory Visit: Payer: Self-pay | Admitting: Orthopedic Surgery

## 2022-10-01 DIAGNOSIS — S32000A Wedge compression fracture of unspecified lumbar vertebra, initial encounter for closed fracture: Secondary | ICD-10-CM

## 2022-10-08 ENCOUNTER — Ambulatory Visit: Payer: Self-pay | Admitting: *Deleted

## 2022-10-08 ENCOUNTER — Other Ambulatory Visit (HOSPITAL_COMMUNITY): Payer: Self-pay | Admitting: Interventional Radiology

## 2022-10-08 ENCOUNTER — Other Ambulatory Visit: Payer: Self-pay | Admitting: Interventional Radiology

## 2022-10-08 ENCOUNTER — Ambulatory Visit
Admission: RE | Admit: 2022-10-08 | Discharge: 2022-10-08 | Disposition: A | Discharge status: Home / Self Care | Payer: No Typology Code available for payment source | Source: Ambulatory Visit | Attending: Orthopedic Surgery | Admitting: Orthopedic Surgery

## 2022-10-08 ENCOUNTER — Other Ambulatory Visit: Payer: Self-pay | Admitting: Physician Assistant

## 2022-10-08 DIAGNOSIS — S32000A Wedge compression fracture of unspecified lumbar vertebra, initial encounter for closed fracture: Secondary | ICD-10-CM

## 2022-10-08 DIAGNOSIS — S32020A Wedge compression fracture of second lumbar vertebra, initial encounter for closed fracture: Secondary | ICD-10-CM

## 2022-10-08 HISTORY — PX: IR RADIOLOGIST EVAL & MGMT: IMG5224

## 2022-10-08 MED ORDER — HYDROCODONE-ACETAMINOPHEN 5-325 MG PO TABS: 1.0000 | ORAL_TABLET | Freq: Four times a day (QID) | ORAL | 0 refills | Status: DC | PRN
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
## 2022-10-09 ENCOUNTER — Other Ambulatory Visit: Payer: Self-pay | Admitting: Obstetrics and Gynecology

## 2022-10-09 ENCOUNTER — Inpatient Hospital Stay: Admission: RE | Admit: 2022-10-09 | Payer: No Typology Code available for payment source | Source: Ambulatory Visit

## 2022-10-09 DIAGNOSIS — Z1231 Encounter for screening mammogram for malignant neoplasm of breast: Secondary | ICD-10-CM

## 2022-10-20 ENCOUNTER — Ambulatory Visit: Payer: No Typology Code available for payment source | Admitting: Family Medicine

## 2022-11-20 ENCOUNTER — Ambulatory Visit: Payer: No Typology Code available for payment source

## 2022-12-15 ENCOUNTER — Ambulatory Visit: Payer: Self-pay | Attending: Nurse Practitioner | Admitting: Nurse Practitioner

## 2022-12-15 ENCOUNTER — Encounter: Payer: Self-pay | Admitting: Nurse Practitioner

## 2022-12-15 ENCOUNTER — Encounter: Payer: Self-pay | Admitting: Family Medicine

## 2022-12-15 VITALS — BP 120/81 | HR 97 | Ht 60.0 in | Wt 184.0 lb

## 2022-12-15 DIAGNOSIS — K760 Fatty (change of) liver, not elsewhere classified: Secondary | ICD-10-CM

## 2022-12-15 DIAGNOSIS — Z1211 Encounter for screening for malignant neoplasm of colon: Secondary | ICD-10-CM

## 2022-12-15 DIAGNOSIS — R3989 Other symptoms and signs involving the genitourinary system: Secondary | ICD-10-CM

## 2022-12-15 DIAGNOSIS — D649 Anemia, unspecified: Secondary | ICD-10-CM

## 2022-12-15 NOTE — Patient Instructions (Signed)
[facility] has been trying to call you to schedule a mammogram. You can call them at any of these numbers to schedule.  [phone]

## 2022-12-15 NOTE — Progress Notes (Signed)
Assessment & Plan:  There are no diagnoses linked to this encounter.  Patient has been counseled on age-appropriate routine health concerns for screening and prevention. These are reviewed and up-to-date. Referrals have been placed accordingly. Immunizations are up-to-date or declined.    Subjective:   Chief Complaint  Patient presents with   Medical Management of Chronic Issues   HPI Alison Farmer 52 y.o. female presents to office today requesting her liver be checked. She is a patient of Dr. Baxter Flattery  [facility] was used to communicate directly with patient for the entire encounter including providing detailed patient instructions.    Patient has been counseled on age-appropriate routine health concerns for screening and prevention. These are reviewed and up-to-date. Referrals have been placed accordingly. Immunizations are up-to-date or declined.     Colon cancer screening: OVERDUE. Referral placed PAP SMEAR: UTD MAMMOGRAM: OVERDUE. Referral placed   Blood pressure is well controlled. She takes metoprolol per her report however it does not appear it has been refilled in quite some time.  BP Readings from Last 3 Encounters:  12/15/22 120/81  10/08/22 138/75  10/17/21 110/84    A few years ago she was told she had fatty liver and she does not know why because she eats healthy however she odes not exercise and BMI 35.94 She denies alcohol use, abdominal pain and does not eat beef.    Review of Systems  Constitutional:  Negative for fever, malaise/fatigue and weight loss.  HENT: Negative.  Negative for nosebleeds.   Eyes: Negative.  Negative for blurred vision, double vision and photophobia.  Respiratory: Negative.  Negative for cough and shortness of breath.   Cardiovascular: Negative.  Negative for chest pain, palpitations and leg swelling.  Gastrointestinal: Negative.  Negative for heartburn, nausea and vomiting.  Musculoskeletal: Negative.  Negative for myalgias.   Neurological: Negative.  Negative for dizziness, focal weakness, seizures and headaches.  Psychiatric/Behavioral: Negative.  Negative for suicidal ideas.     Past Medical History:  Diagnosis Date   Hyperpigmentation    of tongue   Seasonal allergies     Past Surgical History:  Procedure Laterality Date   CESAREAN SECTION     HYSTEROSCOPY  06/15/09   IUD removal   IR RADIOLOGIST EVAL & MGMT  10/08/2022    Family History  Problem Relation Age of Onset   Heart disease Mother 3       often fainted, had palpitations   Heart disease Father    Heart disease Sister        younger sister with palpitations/heart skipping beats requiring hospitalizations and meds   Allergic rhinitis Neg Hx    Angioedema Neg Hx    Eczema Neg Hx    Asthma Neg Hx     Social History Reviewed with no changes to be made today.   Outpatient Medications Prior to Visit  Medication Sig Dispense Refill   metoprolol tartrate (LOPRESSOR) 25 MG tablet Take 25 mg by mouth daily.     fluticasone (FLONASE) 50 MCG/ACT nasal spray Place 2 sprays into both nostrils daily. (Patient not taking: Reported on 07/10/2021) 16 g 6   HYDROcodone-acetaminophen (NORCO) 5-325 MG tablet Take 1 tablet by mouth every 6 (six) hours as needed for up to 15 doses for moderate pain. (Patient not taking: Reported on 12/15/2022) 15 tablet 0   ibuprofen (ADVIL) 600 MG tablet Take 1 tablet (600 mg total) by mouth every 8 (eight) hours as needed. (Patient not taking: Reported on 07/10/2021) 30  tablet 0   promethazine-dextromethorphan (PROMETHAZINE-DM) 6.25-15 MG/5ML syrup Take 5 mLs by mouth 4 (four) times daily as needed for cough. (Patient not taking: Reported on 01/09/2021) 118 mL 0   No facility-administered medications prior to visit.    Allergies  Allergen Reactions   Avocado Itching       Objective:    BP 120/81 (BP Location: Left Arm, Patient Position: Sitting, Cuff Size: Normal)   Pulse 97   Ht 5' (1.524 m)   Wt 184 lb (83.5  kg)   LMP 01/03/2018 (LMP Unknown)   SpO2 97%   BMI 35.94 kg/m  Wt Readings from Last 3 Encounters:  12/15/22 184 lb (83.5 kg)  10/08/22 175 lb (79.4 kg)  10/17/21 180 lb 3.2 oz (81.7 kg)    Physical Exam Vitals and nursing note reviewed.  Constitutional:      Appearance: She is well-developed.  HENT:     Head: Normocephalic and atraumatic.  Cardiovascular:     Rate and Rhythm: Normal rate and regular rhythm.     Heart sounds: Normal heart sounds. No murmur heard.    No friction rub. No gallop.  Pulmonary:     Effort: Pulmonary effort is normal. No tachypnea or respiratory distress.     Breath sounds: Normal breath sounds. No decreased breath sounds, wheezing, rhonchi or rales.  Chest:     Chest wall: No tenderness.  Abdominal:     General: Bowel sounds are normal.     Palpations: Abdomen is soft.  Musculoskeletal:        General: Normal range of motion.     Cervical back: Normal range of motion.  Skin:    General: Skin is warm and dry.  Neurological:     Mental Status: She is alert and oriented to person, place, and time.     Coordination: Coordination normal.  Psychiatric:        Behavior: Behavior normal. Behavior is cooperative.        Thought Content: Thought content normal.        Judgment: Judgment normal.          Patient has been counseled extensively about nutrition and exercise as well as the importance of adherence with medications and regular follow-up. The patient was given clear instructions to go to ER or return to medical center if symptoms don't improve, worsen or new problems develop. The patient verbalized understanding.   Follow-up: No follow-ups on file.   Claiborne Rigg, FNP-BC [facility] [place], [place] [phone]   12/15/2022, 11:16 AM

## 2022-12-16 LAB — CMP14+EGFR
ALT: 14 [IU]/L (ref 0–32)
AST: 18 [IU]/L (ref 0–40)
Albumin: 4.4 g/dL (ref 3.8–4.9)
Alkaline Phosphatase: 114 [IU]/L (ref 44–121)
BUN/Creatinine Ratio: 10 (ref 9–23)
BUN: 7 mg/dL (ref 6–24)
Bilirubin Total: 0.2 mg/dL (ref 0.0–1.2)
CO2: 24 mmol/L (ref 20–29)
Calcium: 9.5 mg/dL (ref 8.7–10.2)
Chloride: 106 mmol/L (ref 96–106)
Creatinine, Ser: 0.69 mg/dL (ref 0.57–1.00)
Globulin, Total: 2.7 g/dL (ref 1.5–4.5)
Glucose: 103 mg/dL — ABNORMAL HIGH (ref 70–99)
Potassium: 3.9 mmol/L (ref 3.5–5.2)
Sodium: 143 mmol/L (ref 134–144)
Total Protein: 7.1 g/dL (ref 6.0–8.5)
eGFR: 105 mL/min/{1.73_m2} (ref >59)

## 2022-12-16 LAB — LIPID PANEL
Chol/HDL Ratio: 4 {ratio} (ref 0.0–4.4)
Cholesterol, Total: 171 mg/dL (ref 100–199)
HDL: 43 mg/dL (ref >39)
LDL Chol Calc (NIH): 103 mg/dL — ABNORMAL HIGH (ref 0–99)
Triglycerides: 143 mg/dL (ref 0–149)
VLDL Cholesterol Cal: 25 mg/dL (ref 5–40)

## 2022-12-16 LAB — URINALYSIS, COMPLETE
Bilirubin, UA: NEGATIVE
Glucose, UA: NEGATIVE
Ketones, UA: NEGATIVE
Leukocytes,UA: NEGATIVE
Nitrite, UA: NEGATIVE
Protein,UA: NEGATIVE
RBC, UA: NEGATIVE
Specific Gravity, UA: 1.016 (ref 1.005–1.030)
Urobilinogen, Ur: 0.2 mg/dL (ref 0.2–1.0)
pH, UA: 7.5 (ref 5.0–7.5)

## 2022-12-16 LAB — CBC WITH DIFFERENTIAL/PLATELET
Basophils Absolute: 0.1 10*3/uL (ref 0.0–0.2)
Basos: 1 %
EOS (ABSOLUTE): 0.3 10*3/uL (ref 0.0–0.4)
Eos: 4 %
Hematocrit: 37.4 % (ref 34.0–46.6)
Hemoglobin: 12.2 g/dL (ref 11.1–15.9)
Immature Grans (Abs): 0 10*3/uL (ref 0.0–0.1)
Immature Granulocytes: 0 %
Lymphocytes Absolute: 2.5 10*3/uL (ref 0.7–3.1)
Lymphs: 37 %
MCH: 26.2 pg — ABNORMAL LOW (ref 26.6–33.0)
MCHC: 32.6 g/dL (ref 31.5–35.7)
MCV: 80 fL (ref 79–97)
Monocytes Absolute: 0.3 10*3/uL (ref 0.1–0.9)
Monocytes: 5 %
Neutrophils Absolute: 3.5 10*3/uL (ref 1.4–7.0)
Neutrophils: 53 %
Platelets: 195 10*3/uL (ref 150–450)
RBC: 4.66 x10E6/uL (ref 3.77–5.28)
RDW: 13.2 % (ref 11.7–15.4)
WBC: 6.6 10*3/uL (ref 3.4–10.8)

## 2022-12-16 LAB — MICROSCOPIC EXAMINATION
Bacteria, UA: NONE SEEN
Casts: NONE SEEN /[LPF]
RBC, Urine: NONE SEEN /[HPF] (ref 0–2)
WBC, UA: NONE SEEN /[HPF] (ref 0–5)

## 2023-01-19 ENCOUNTER — Encounter: Payer: Self-pay | Admitting: Family Medicine

## 2023-05-24 NOTE — Progress Notes (Deleted)
   Established Patient Office Visit  Subjective   Patient ID: Alison Farmer, female    DOB: 05/02/1970  Age: 53 y.o. MRN: [id number]  No chief complaint on file.   53 y.o.F here for f/u of liver  pcp [place]     {History (Optional):23778}  ROS    Objective:     LMP 01/03/2018 (LMP Unknown)  {Vitals History (Optional):23777}  Physical Exam   No results found for any visits on 05/28/23.  {Labs (Optional):23779}  The 10-year ASCVD risk score (Arnett DK, et al., 2019) is: 1.4%    Assessment & Plan:   Problem List Items Addressed This Visit   None   No follow-ups on file.    Shan Levans, MD

## 2023-05-28 ENCOUNTER — Ambulatory Visit: Payer: No Typology Code available for payment source | Admitting: Critical Care Medicine

## 2023-06-04 ENCOUNTER — Ambulatory Visit: Payer: Self-pay | Attending: Physician Assistant | Admitting: Physician Assistant

## 2023-06-04 ENCOUNTER — Encounter: Payer: Self-pay | Admitting: Physician Assistant

## 2023-06-04 ENCOUNTER — Other Ambulatory Visit: Payer: Self-pay

## 2023-06-04 VITALS — BP 127/80 | HR 81 | Wt 179.6 lb

## 2023-06-04 DIAGNOSIS — Z758 Other problems related to medical facilities and other health care: Secondary | ICD-10-CM

## 2023-06-04 DIAGNOSIS — K760 Fatty (change of) liver, not elsewhere classified: Secondary | ICD-10-CM

## 2023-06-04 DIAGNOSIS — R030 Elevated blood-pressure reading, without diagnosis of hypertension: Secondary | ICD-10-CM

## 2023-06-04 DIAGNOSIS — R32 Unspecified urinary incontinence: Secondary | ICD-10-CM

## 2023-06-04 DIAGNOSIS — Z603 Acculturation difficulty: Secondary | ICD-10-CM

## 2023-06-04 MED ORDER — OXYBUTYNIN CHLORIDE ER 5 MG PO TB24: 5.0000 mg | ORAL_TABLET | Freq: Every day | ORAL | 5 refills | Status: DC

## 2023-06-04 MED ORDER — OXYBUTYNIN CHLORIDE ER 5 MG PO TB24
5.0000 mg | ORAL_TABLET | Freq: Every day | ORAL | 5 refills | Status: AC
  Filled 2023-06-04: qty 30, 30d supply, fill #0

## 2023-06-04 NOTE — Patient Instructions (Signed)
 Enfermedad del hgado graso (esteatosis heptica): Qu debe saber Fatty Liver Disease (Steatotic Liver Disease): What to Know  El hgado es un rgano que se encarga de muchas tareas. Produce protenas y [place] a Chartered certified accountant. Tambin elimina las cosas nocivas de la sangre y absorbe las vitaminas de los alimentos. La enfermedad del hgado graso sucede cuando se acumula demasiada grasa en las clulas del hgado. Tambin se la denomina esteatosis heptica. En muchos casos, la enfermedad del hgado graso no provoca sntomas. Pero con el tiempo, puede causar irritacin e hinchazn. Esto puede derivar en otros problemas hepticos como, por ejemplo: La cirrosis, o formacin de cicatrices en el hgado. Cncer de hgado. Insuficiencia heptica. Cules son las causas? Las causas de la enfermedad del hgado graso pueden ser las siguientes: Tener sobrepeso. Tener: Engineer, production. Hipertensin arterial. Sndrome de [place]. No obtener suficientes nutrientes en la dieta. Otras causas son: Determinados medicamentos. Txicos. Algunas infecciones provocadas por un germen llamado virus. Qu incrementa el riesgo? Es ms probable que tenga la enfermedad del hgado graso si: Bebe alcohol. Tiene sobrepeso. Tiene diabetes. Tiene hepatitis. Tiene un nivel alto de triglicridos. Est embarazada. Cules son los signos o sntomas? Es posible que no presente sntomas. Si los tiene, pueden [employer] siguientes: Sensacin de debilidad y [place]. Bajar de [place]. Ganas de vomitar. Vmitos. Ictericia. Sucede cuando la piel y la parte blanca de los ojos se ponen amarillos. Hinchazn en el vientre o las piernas. Dolor a Merchandiser, retail parte superior derecha del vientre. Cmo se diagnostica? La enfermedad del hgado graso se puede diagnosticar en funcin de sus antecedentes mdicos y un examen. Tambin podra necesitar pruebas. Pueden incluir: Anlisis de sangre. Una  ecografa. Una exploracin por tomografa computarizada (TC). Resonancia magntica (RM). Biopsia. Consiste en extraer Carlynn Herald de tejido del hgado para analizarla. Cmo se trata? Con frecuencia, la enfermedad del hgado graso es causada por otras afecciones. Tal vez deba tomar medicamentos y Radio producer cambios en su estilo de vida. Estos cambios pueden ayudarlo a Warehouse manager las siguientes: Trastorno por consumo de alcohol. Se trata de una afeccin en la que puede resultar difcil dejar de beber. Colesterol alto. Diabetes. Tener sobrepeso. Siga estas indicaciones en su casa: Consuma una dieta saludable. Trabaje con su mdico o con un experto en alimentacin saludable llamado nutricionista. Ellos pueden ayudarlo a Runner, broadcasting/film/video de alimentacin. Ejerctese lo suficiente. Esto puede ayudarlo a Publishing copy de [place]. Tambin puede ayudarlo a Public house manager y la diabetes. Hable con su mdico sobre un plan de ejercicio. Pregunte cules son las mejores actividades para que haga. No beba alcohol. Si tiene problemas para dejar de fumar, pdale ayuda al mdico. Use sus medicamentos nicamente segn las indicaciones. Concurra a todas las visitas de seguimiento. El mdico controlar si su situacin mejora. Comunquese con un mdico si: No puede controlar su nivel de azcar en la sangre. Esto es sumamente importante si tiene diabetes. Tiene fiebre. Tiene el vientre o las piernas hinchados. Siente dolor en el abdomen. Tiene ictericia. Tiene ganas de vomitar. Vomita. Solicite ayuda de inmediato si: Vomita, y el vmito tiene el siguiente aspecto: Sangre de color rojo brillante. Borra de caf. Vomita una sustancia que se parece a la borra de caf. Su materia fecal se ve sanguinolenta o negra. Se siente confundido. Estos sntomas pueden Customer service manager. Llame al 911 de inmediato. No espere a ver si los sntomas desaparecen. No conduzca por sus propios medios [employer]. [place]ta [place]  informacin no tiene Theme park manager el consejo del mdico. Asegrese de hacerle al mdico cualquier pregunta que tenga. Document Revised: 10/14/2022 Document Reviewed: 10/14/2022 Elsevier Patient Education  2024 [employer].

## 2023-06-04 NOTE — Progress Notes (Signed)
 Patient ID: Alison Farmer, female   DOB: [birth year], 53 y.o.   MRN: [id number]   Alison Farmer, is a 53 y.o. female  [id number]  [id number]  DOB - January 20, 1971  Chief Complaint  Patient presents with   Medical Management of Chronic Issues    Patient stated that she is concerned about her liver(fatty liver) and congestion. Patient also stated that her stomach feels heavy and big.       Subjective:   Alison Farmer is a 53 y.o. female here today for several issues.  She is wanting to repeat abdominal ultrasound that was last done in 2021 and showed fatty liver.  She denies N/V/abdominal pain.  No rash.  No pruritus.    Also c/o OAB since being in menopause.  She uses panty liners and sometimes can't make it to the bathroom.  She would ike to try a medication for this.  No period in about 5 years.  No dysuria or frequency.    Currently not taking any medications.  Denies depression/anxiety.  No SI/HI No problems updated.  ALLERGIES: Allergies  Allergen Reactions   Avocado Itching    PAST MEDICAL HISTORY: Past Medical History:  Diagnosis Date   Hyperpigmentation    of tongue   Seasonal allergies     MEDICATIONS AT HOME: Prior to Admission medications   Medication Sig Start Date End Date Taking? Authorizing Provider  oxybutynin (DITROPAN-XL) 5 MG 24 hr tablet Take 1 tablet (5 mg total) by mouth at bedtime. 06/04/23   Khamani Daniely, Marzella Schlein, PA-C    ROS: Neg HEENT Neg resp Neg cardiac Neg GI Neg GU Neg MS Neg psych Neg neuro  Objective:   Vitals:   06/04/23 0835  BP: (!) 140/89  Pulse: 81  SpO2: 96%  Weight: 179 lb 9.6 oz (81.5 kg)   Exam General appearance : Awake, alert, not in any distress. Speech Clear. Not toxic looking HEENT: Atraumatic and Normocephalic Neck: Supple, no JVD. No cervical lymphadenopathy.  Chest: Good air entry bilaterally, CTAB.  No rales/rhonchi/wheezing CVS: S1 S2 regular, no murmurs.   Abdomen: Bowel sounds present, Non tender and not distended with no gaurding, rigidity or rebound. Extremities: B/L Lower Ext shows no edema, both legs are warm to touch Neurology: Awake alert, and oriented X 3, CN II-XII intact, Non focal Skin: No Rash  Data Review Lab Results  Component Value Date   HGBA1C 5.6 09/05/2016    Assessment & Plan   1. Fatty liver disease, nonalcoholic (Primary) Gave fatty liver h/o - US ABDOMEN LIMITED RUQ (LIVER/GB); Future - Hepatic Function Panel  2. Language barrier [employer] "Union" interpreters used and additional time performing visit was required.   3. Urinary incontinence, unspecified type - oxybutynin (DITROPAN-XL) 5 MG 24 hr tablet; Take 1 tablet (5 mg total) by mouth at bedtime.  Dispense: 30 tablet; Refill: 5  4. Elevated BP without diagnosis of hypertension Much better on second reading    Return in about 6 months (around 12/05/2023) for PCP for chronic conditions-[place].  The patient was given clear instructions to go to ER or return to medical center if symptoms don't improve, worsen or new problems develop. The patient verbalized understanding. The patient was told to call to get lab results if they haven't heard anything in the next week.      Georgian Co, PA-C [facility] and [facility] [place], [place] [phone]   06/04/2023, 9:07 AM

## 2023-06-04 NOTE — Progress Notes (Signed)
 [id number]

## 2023-06-05 LAB — HEPATIC FUNCTION PANEL
ALT: 44 IU/L — ABNORMAL HIGH (ref 0–32)
AST: 34 IU/L (ref 0–40)
Albumin: 4.7 g/dL (ref 3.8–4.9)
Alkaline Phosphatase: 101 IU/L (ref 44–121)
Bilirubin Total: 0.3 mg/dL (ref 0.0–1.2)
Bilirubin, Direct: 0.11 mg/dL (ref 0.00–0.40)
Total Protein: 7.7 g/dL (ref 6.0–8.5)

## 2023-12-07 ENCOUNTER — Ambulatory Visit: Admitting: Family Medicine

## 2023-12-14 ENCOUNTER — Ambulatory Visit: Payer: Self-pay | Attending: Family Medicine | Admitting: Family Medicine

## 2023-12-14 ENCOUNTER — Encounter: Payer: Self-pay | Admitting: Family Medicine

## 2023-12-14 VITALS — BP 135/85 | HR 97 | Ht 60.0 in | Wt 185.6 lb

## 2023-12-14 DIAGNOSIS — Z1231 Encounter for screening mammogram for malignant neoplasm of breast: Secondary | ICD-10-CM

## 2023-12-14 DIAGNOSIS — R1031 Right lower quadrant pain: Secondary | ICD-10-CM

## 2023-12-14 DIAGNOSIS — R7303 Prediabetes: Secondary | ICD-10-CM

## 2023-12-14 DIAGNOSIS — Z131 Encounter for screening for diabetes mellitus: Secondary | ICD-10-CM

## 2023-12-14 DIAGNOSIS — R202 Paresthesia of skin: Secondary | ICD-10-CM

## 2023-12-14 DIAGNOSIS — Z6836 Body mass index (BMI) 36.0-36.9, adult: Secondary | ICD-10-CM

## 2023-12-14 DIAGNOSIS — Z13228 Encounter for screening for other metabolic disorders: Secondary | ICD-10-CM

## 2023-12-14 DIAGNOSIS — E669 Obesity, unspecified: Secondary | ICD-10-CM

## 2023-12-14 LAB — POCT GLYCOSYLATED HEMOGLOBIN (HGB A1C): HbA1c, POC (prediabetic range): 5.8 % (ref 5.7–6.4)
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
## 2023-12-14 NOTE — Patient Instructions (Signed)
 Prediabetes: Plan de alimentacin Prediabetes: Eating Plan La prediabetes es cuando los niveles de azcar en la sangre, tambin llamada glucosa, son ms altos de lo normal. Esto puede ponerlo en riesgo de desarrollar diabetes tipo 2. Cuando tiene prediabetes, hacer cambios saludables puede ayudar a evitar que desarrolle diabetes. Esto incluye hacer cambios en la dieta. Trabaje con su mdico o con un experto en alimentacin saludable llamado nutricionista. Ellos pueden ayudarlo a crear un plan de alimentacin saludable. Este plan puede ser de ayuda para: Controlar los niveles de Banker. Mejorar los niveles de [place]. Controlar la presin arterial. Consejos para seguir este plan? Lea las etiquetas de los alimentos Lea las etiquetas de los alimentos envasados para controlar la cantidad de grasa y International aid/development worker que contienen. Evite los alimentos que contengan lo siguiente: Grasas saturadas. Grasas trans. Azcares agregados. Revise la cantidad de sal (sodio) en las etiquetas de los alimentos. Evite los alimentos que contengan ms de 300 miligramos (mg) de sal por porcin. Limite el consumo de sal a menos de 2,300 mg por da. Al ir de compras Evite comprar alimentos procesados y preelaborados. Evite comprar bebidas con azcar agregada. Al cocinar Cocine con aceite de oliva. No use: Mantequilla. Manteca de cerdo. Mantequilla clarificada. Cocine los alimentos al horno, a la parrilla, asados, al vapor o hervidos. Evite frerlos. Planificacin de las comidas  Trabaje con el nutricionista para crear un plan de alimentacin que sea adecuado para usted. Esto puede incluir el seguimiento de cuntas caloras ingiere al da. Use un registro de alimentos, un cuaderno o una aplicacin mvil para anotar lo que comi en cada comida. Considere la posibilidad de seguir Clinical cytogeneticist. Esto incluye: Comer muchas porciones de frutas y verduras frescas por Futures trader. Pescado al [employer] veces por  semana. Comer una porcin de cereales integrales, frijoles, frutos secos y semillas por da. Aceite de Location manager de otras grasas. Limitar el consumo de alcohol. Limitar la carne roja. Usar productos lcteos descremados o con bajo contenido de [place]. Considere seguir Neomia Dear dieta a base de vegetales. Esto significa comer principalmente: Verduras y frutas. Granos. Frijoles. Frutos secos y semillas. Si tiene hipertensin arterial, quizs deba limitar el consumo de sal o seguir una dieta como el plan de alimentacin de Enfoques Alimentarios para Detener la Hipertensin (Dietary Approaches to Stop Hypertension, DASH). El plan de alimentacin DASH puede ayudar a disminuir la presin arterial alta. Estilo de vida Establezca metas para bajar de peso con la ayuda de su equipo de atencin mdica. Bajar el 7 % del peso corporal es un buen objetivo para la mayora de las personas con prediabetes. Haga al menos 30 minutos de ejercicio, 5 o ms das a la semana. Para obtener apoyo, piense en unirse a un grupo de apoyo o Heritage manager con un consejero de salud mental. Tome los medicamentos nicamente como se lo hayan indicado. Qu alimentos se recomiendan? Nils Pyle Bayas. Bananas. Manzanas. Naranjas. Uvas. Papaya. Mango. Granada. Kiwi. Pomelo. Cerezas. Hoover Brunette Deatra James. Espinaca. Guisantes. Remolachas. Coliflor. Repollo. Brcoli. Zanahorias. Tomates. Calabaza. Christella Noa. Hierbas. Pimientos. Cebollas. Pepinos. Coles de Bruselas. Granos Cereales integrales, como pan o pasta de trigo integral o de otros cereales integrales. Avena sin azcar. Trigo burgol. Cebada. Quinua. Arroz integral. Tacos o tortillas de harina de maz o de salvado. Carnes y otras protenas Frutos de mar. Carne de ave sin piel. Cortes magros de cerdo y carne de res. Tofu. Huevos. Frutos secos. Frijoles. Lcteos Productos lcteos descremados o semidescremados, como yogur, queso cottage y [place]. Bebidas  Agua. T. Caf. Gaseosas sin azcar o  dietticas. Soda. Leche descremada o con bajo contenido de Piqua. Productos alternativos a la [place], como [place] de soja o de [place]. Grasas y aceites Aceite de [place]. Aceite de canola. Aceite de girasol. Aceite de semillas de uva. Aguacate. Nueces. Dulces y postres Pudin sin azcar o con bajo contenido de [place]. Helado y otros postres congelados sin azcar o con bajo contenido de [place]. Alios y condimentos Hierbas. Especias sin sal. Mostaza. Salsa de pepinillos. K[place]chup con bajo contenido de sal y de International aid/development worker. Salsa barbacoa con bajo contenido de sal y de azcar. Mayonesa con bajo contenido de grasa o sin grasa. Es posible que los productos que se enumeran ms arriba no sean todos los alimentos y las bebidas que puede consumir. Consulte a su nutricionista para obtener ms informacin. Qu alimentos no se recomiendan? Nils Pyle Frutas enlatadas al almbar. Verduras Verduras enlatadas. Verduras congeladas con mantequilla o salsa de crema. Granos Productos elaborados con [place] y [place], como panes, pastas, bocadillos y cereales. Carnes y [address] de carne con alto contenido de Holiday representative. Carne de ave con piel. Carne empanizada o frita. Carnes procesadas. Lcteos Yogur, queso o [employer]. Bebidas Bebidas azucaradas, como t helado y [place]. Grasas y [employer]. Manteca de cerdo. Mantequilla clarificada. Dulces y [employer], como pasteles, [place], [place], galletas dulces y tarta de [place]. Alios y condimentos Mezclas de especias con sal agregada. Ktchup. Salsa barbacoa. Mayonesa. Es posible que los productos que se enumeran ms arriba no sean todos los alimentos y las bebidas que Personnel officer. Consulte a su nutricionista para obtener ms informacin. Dnde obtener ms informacin American Diabetes Association (Asociacin Estadounidense de la Diabetes): diabetes.org/food-nutrition Esta informacin no tiene Theme park manager  el consejo del mdico. Asegrese de hacerle al mdico cualquier pregunta que tenga. Document Revised: 11/06/2022 Document Reviewed: 11/06/2022 Elsevier Patient Education  2024 [employer].

## 2023-12-15 ENCOUNTER — Encounter: Payer: Self-pay | Admitting: Family Medicine

## 2023-12-15 ENCOUNTER — Ambulatory Visit: Payer: Self-pay | Attending: Family Medicine

## 2023-12-15 DIAGNOSIS — R1031 Right lower quadrant pain: Secondary | ICD-10-CM

## 2023-12-15 DIAGNOSIS — R202 Paresthesia of skin: Secondary | ICD-10-CM

## 2023-12-15 DIAGNOSIS — Z13228 Encounter for screening for other metabolic disorders: Secondary | ICD-10-CM

## 2023-12-16 ENCOUNTER — Ambulatory Visit: Payer: Self-pay | Admitting: Family Medicine

## 2023-12-16 LAB — H. PYLORI BREATH TEST: H pylori Breath Test: NEGATIVE

## 2023-12-16 LAB — CBC WITH DIFFERENTIAL/PLATELET
Basophils Absolute: 0 x10E3/uL (ref 0.0–0.2)
Basos: 1 %
EOS (ABSOLUTE): 0.3 x10E3/uL (ref 0.0–0.4)
Eos: 5 %
Hematocrit: 39.4 % (ref 34.0–46.6)
Hemoglobin: 12.5 g/dL (ref 11.1–15.9)
Immature Grans (Abs): 0 x10E3/uL (ref 0.0–0.1)
Immature Granulocytes: 0 %
Lymphocytes Absolute: 2.2 x10E3/uL (ref 0.7–3.1)
Lymphs: 35 %
MCH: 25.7 pg — ABNORMAL LOW (ref 26.6–33.0)
MCHC: 31.7 g/dL (ref 31.5–35.7)
MCV: 81 fL (ref 79–97)
Monocytes Absolute: 0.3 x10E3/uL (ref 0.1–0.9)
Monocytes: 5 %
Neutrophils Absolute: 3.4 x10E3/uL (ref 1.4–7.0)
Neutrophils: 54 %
Platelets: 201 x10E3/uL (ref 150–450)
RBC: 4.86 x10E6/uL (ref 3.77–5.28)
RDW: 14 % (ref 11.7–15.4)
WBC: 6.3 x10E3/uL (ref 3.4–10.8)

## 2023-12-16 LAB — MICROSCOPIC EXAMINATION
Bacteria, UA: NONE SEEN
Casts: NONE SEEN /LPF
WBC, UA: NONE SEEN /HPF (ref 0–5)

## 2023-12-16 LAB — URINALYSIS, COMPLETE
Bilirubin, UA: NEGATIVE
Glucose, UA: NEGATIVE
Ketones, UA: NEGATIVE
Leukocytes,UA: NEGATIVE
Nitrite, UA: NEGATIVE
Protein,UA: NEGATIVE
RBC, UA: NEGATIVE
Specific Gravity, UA: 1.021 (ref 1.005–1.030)
Urobilinogen, Ur: 0.2 mg/dL (ref 0.2–1.0)
pH, UA: 5.5 (ref 5.0–7.5)

## 2023-12-16 LAB — H. PYLORI BREATH COLLECTION

## 2023-12-16 LAB — CMP14+EGFR
ALT: 23 IU/L (ref 0–32)
AST: 23 IU/L (ref 0–40)
Albumin: 4.4 g/dL (ref 3.8–4.9)
Alkaline Phosphatase: 110 IU/L (ref 49–135)
BUN/Creatinine Ratio: 10 (ref 9–23)
BUN: 6 mg/dL (ref 6–24)
Bilirubin Total: 0.3 mg/dL (ref 0.0–1.2)
CO2: 23 mmol/L (ref 20–29)
Calcium: 9.3 mg/dL (ref 8.7–10.2)
Chloride: 106 mmol/L (ref 96–106)
Creatinine, Ser: 0.6 mg/dL (ref 0.57–1.00)
Globulin, Total: 2.9 g/dL (ref 1.5–4.5)
Glucose: 91 mg/dL (ref 70–99)
Potassium: 4.2 mmol/L (ref 3.5–5.2)
Sodium: 142 mmol/L (ref 134–144)
Total Protein: 7.3 g/dL (ref 6.0–8.5)
eGFR: 108 mL/min/1.73 (ref >59)

## 2023-12-16 LAB — LP+NON-HDL CHOLESTEROL
Cholesterol, Total: 185 mg/dL (ref 100–199)
HDL: 44 mg/dL (ref >39)
LDL Chol Calc (NIH): 115 mg/dL — ABNORMAL HIGH (ref 0–99)
Total Non-HDL-Chol (LDL+VLDL): 141 mg/dL — ABNORMAL HIGH (ref 0–129)
Triglycerides: 148 mg/dL (ref 0–149)
VLDL Cholesterol Cal: 26 mg/dL (ref 5–40)

## 2023-12-16 LAB — VITAMIN B12: Vitamin B-12: 561 pg/mL (ref 232–1245)

## 2023-12-17 ENCOUNTER — Other Ambulatory Visit: Payer: Self-pay | Admitting: Obstetrics and Gynecology

## 2023-12-17 DIAGNOSIS — Z1231 Encounter for screening mammogram for malignant neoplasm of breast: Secondary | ICD-10-CM

## 2023-12-24 ENCOUNTER — Encounter (INDEPENDENT_AMBULATORY_CARE_PROVIDER_SITE_OTHER): Payer: Self-pay

## 2024-02-15 ENCOUNTER — Encounter: Admitting: Family Medicine

## 2024-03-31 ENCOUNTER — Ambulatory Visit: Payer: Self-pay | Admitting: *Deleted

## 2024-03-31 ENCOUNTER — Ambulatory Visit
Admission: RE | Admit: 2024-03-31 | Discharge: 2024-03-31 | Disposition: A | Source: Ambulatory Visit | Attending: Obstetrics and Gynecology | Admitting: Obstetrics and Gynecology

## 2024-03-31 VITALS — Wt 185.0 lb

## 2024-03-31 DIAGNOSIS — Z1231 Encounter for screening mammogram for malignant neoplasm of breast: Secondary | ICD-10-CM

## 2024-03-31 DIAGNOSIS — Z1211 Encounter for screening for malignant neoplasm of colon: Secondary | ICD-10-CM

## 2024-03-31 DIAGNOSIS — Z01419 Encounter for gynecological examination (general) (routine) without abnormal findings: Secondary | ICD-10-CM

## 2024-03-31 NOTE — Patient Instructions (Signed)
 Explained breast self awareness with Alison Farmer. Pap smear completed today. Let her know B[facility]CP will cover Pap smears and HPV typing every 5 years unless has a history of abnormal Pap smears. Referred patient to the [facility] of [facility] for a screening mammogram on mobile unit. Appointment scheduled Thursday, March 31, 2024 at 1000. Patient aware of appointment and will be there. Let patient know will follow up with her within the next couple weeks with results of Pap smear by letter or phone. Informed patient that the Breast Center will follow up with her within the next couple of weeks with results of her mammogram by letter or phone. [name] verbalized understanding.  Shaley Leavens, Wanda Ship, RN 9:30 AM

## 2024-03-31 NOTE — Progress Notes (Signed)
 Ms. Malea Swilling is a 54 y.o. No obstetric history on file. female who presents to [facility] clinic today with complaint of occasional left lower breast pain when touches that was noted on previous exam 10/17/2021.    Pap Smear: Pap smear completed today. Last Pap smear was 02/09/2019 at [facility] and was normal with negative HPV. Per patient has no history of an abnormal Pap smear. Last Pap smear result is available in Epic    Physical exam: Breasts Breasts symmetrical. No skin abnormalities bilateral breasts. No nipple retraction bilateral breasts. No nipple discharge bilateral breasts. No lymphadenopathy. No lumps palpated bilateral breasts. No complaints of pain or tenderness on exam.   MS DIGITAL SCREENING TOMO BILATERAL Result Date: 10/18/2021 CLINICAL DATA:  Screening. EXAM: DIGITAL SCREENING BILATERAL MAMMOGRAM WITH TOMOSYNTHESIS AND CAD TECHNIQUE: Bilateral screening digital craniocaudal and mediolateral oblique mammograms were obtained. Bilateral screening digital breast tomosynthesis was performed. The images were evaluated with computer-aided detection. COMPARISON:  Previous exam(s). ACR Breast Density Category c: The breast tissue is heterogeneously dense, which may obscure small masses. FINDINGS: There are no findings suspicious for malignancy. IMPRESSION: No mammographic evidence of malignancy. A result letter of this screening mammogram will be mailed directly to the patient. RECOMMENDATION: Screening mammogram in one year. (Code:SM-B-01Y) BI-RADS CATEGORY  1: Negative. Electronically Signed   By: Dobrinka  Dimitrova M.D.   On: 10/18/2021 16:28   Pelvic/Bimanual Ext Genitalia No lesions, no swelling and no discharge observed on external genitalia.        Vagina Vagina pink and normal texture. No lesions or discharge observed in vagina.        Cervix Cervix is present. Cervix pink and slightly reddened around os. Cervix normal texture.  No discharge observed.    Uterus Uterus is present and palpable. Uterus in normal position and normal size.        Adnexae Bilateral ovaries present and palpable. No tenderness on palpation.         Rectovaginal No rectal exam completed today since patient had no rectal complaints. No skin abnormalities observed on exam.     Smoking History: Patient has never smoked.   Patient Navigation: Patient education provided. Access to services provided for patient through [place] program. Spanish interpreter Bernice Angry from [facility] provided.   Colorectal Cancer Screening: Per patient has never had colonoscopy completed. FIT Test given to patient to complete. No complaints today.    Breast and Cervical Cancer Risk Assessment: Patient does not have family history of breast cancer, known genetic mutations, or radiation treatment to the chest before age 32. Patient does not have history of cervical dysplasia, immunocompromised, or DES exposure in-utero.  Risk Assessment   No risk assessment data for the current encounter  Risk Scores       10/17/2021   Last edited by: Logan Lyle BRAVO, CMA   5-year risk: 1%   Lifetime risk: 8.6%            A: BCCCP exam with pap smear Complaint of occasional left breast pain when touches.  P: Referred patient to the [facility] of [facility] for a screening mammogram on mobile unit. Appointment scheduled Thursday, March 31, 2024 at 1000.  Driscilla Wanda SQUIBB, RN 03/31/2024 9:31 AM

## 2024-04-01 LAB — CYTOLOGY - PAP
Comment: NEGATIVE
Diagnosis: NEGATIVE
High risk HPV: NEGATIVE

## 2024-04-04 ENCOUNTER — Ambulatory Visit: Payer: Self-pay

## 2024-04-05 ENCOUNTER — Institutional Professional Consult (permissible substitution) (INDEPENDENT_AMBULATORY_CARE_PROVIDER_SITE_OTHER): Payer: Self-pay | Admitting: Nurse Practitioner

## 2024-04-06 ENCOUNTER — Ambulatory Visit: Admitting: Family Medicine

## 2024-04-15 ENCOUNTER — Telehealth: Payer: Self-pay | Admitting: Family Medicine

## 2024-04-15 NOTE — Telephone Encounter (Signed)
 1st attempt left vm to resch appt due to the weather office will be closed if the pt calls back please resch appt

## 2024-04-18 ENCOUNTER — Ambulatory Visit: Payer: Self-pay | Admitting: Family Medicine

## 2024-05-02 ENCOUNTER — Institutional Professional Consult (permissible substitution) (INDEPENDENT_AMBULATORY_CARE_PROVIDER_SITE_OTHER): Payer: Self-pay | Admitting: Nurse Practitioner

## 2024-06-01 ENCOUNTER — Ambulatory Visit: Payer: Self-pay | Admitting: Family Medicine
